# Patient Record
Sex: Female | Born: 1955 | Race: Black or African American | Hispanic: Yes | Marital: Married | Smoking: Never smoker
Health system: Southern US, Community
[De-identification: ages and names within clinical notes are randomized; demographics above are authoritative.]

## PROBLEM LIST (undated history)

## (undated) DIAGNOSIS — N390 Urinary tract infection, site not specified: Secondary | ICD-10-CM

## (undated) DIAGNOSIS — R112 Nausea with vomiting, unspecified: Secondary | ICD-10-CM

## (undated) DIAGNOSIS — T7840XA Allergy, unspecified, initial encounter: Secondary | ICD-10-CM

## (undated) DIAGNOSIS — F419 Anxiety disorder, unspecified: Secondary | ICD-10-CM

## (undated) DIAGNOSIS — F32A Depression, unspecified: Secondary | ICD-10-CM

## (undated) DIAGNOSIS — I890 Lymphedema, not elsewhere classified: Secondary | ICD-10-CM

## (undated) DIAGNOSIS — Z9889 Other specified postprocedural states: Secondary | ICD-10-CM

## (undated) DIAGNOSIS — Z8719 Personal history of other diseases of the digestive system: Secondary | ICD-10-CM

## (undated) DIAGNOSIS — M1990 Unspecified osteoarthritis, unspecified site: Secondary | ICD-10-CM

## (undated) DIAGNOSIS — Z860100 Personal history of colon polyps, unspecified: Secondary | ICD-10-CM

## (undated) DIAGNOSIS — K219 Gastro-esophageal reflux disease without esophagitis: Secondary | ICD-10-CM

## (undated) DIAGNOSIS — G039 Meningitis, unspecified: Secondary | ICD-10-CM

## (undated) DIAGNOSIS — F329 Major depressive disorder, single episode, unspecified: Secondary | ICD-10-CM

## (undated) HISTORY — DX: Personal history of colonic polyps: Z86.010

## (undated) HISTORY — DX: Anxiety disorder, unspecified: F41.9

## (undated) HISTORY — PX: COLONOSCOPY: SHX174

## (undated) HISTORY — DX: Gastro-esophageal reflux disease without esophagitis: K21.9

## (undated) HISTORY — PX: CERVICAL DISCECTOMY: SHX98

## (undated) HISTORY — DX: Personal history of other diseases of the digestive system: Z87.19

## (undated) HISTORY — PX: TOTAL ABDOMINAL HYSTERECTOMY: SHX209

## (undated) HISTORY — PX: SHOULDER SURGERY: SHX246

## (undated) HISTORY — DX: Allergy, unspecified, initial encounter: T78.40XA

## (undated) HISTORY — DX: Urinary tract infection, site not specified: N39.0

## (undated) HISTORY — DX: Unspecified osteoarthritis, unspecified site: M19.90

## (undated) HISTORY — DX: Lymphedema, not elsewhere classified: I89.0

## (undated) HISTORY — PX: ESOPHAGOGASTRODUODENOSCOPY: SHX1529

## (undated) HISTORY — PX: CHOLECYSTECTOMY: SHX55

## (undated) HISTORY — DX: Depression, unspecified: F32.A

## (undated) HISTORY — DX: Major depressive disorder, single episode, unspecified: F32.9

## (undated) HISTORY — PX: ELBOW SURGERY: SHX618

## (undated) HISTORY — PX: FLEXIBLE SIGMOIDOSCOPY: SHX1649

## (undated) HISTORY — DX: Personal history of colon polyps, unspecified: Z86.0100

---

## 1997-07-28 ENCOUNTER — Ambulatory Visit (HOSPITAL_BASED_OUTPATIENT_CLINIC_OR_DEPARTMENT_OTHER): Admission: RE | Admit: 1997-07-28 | Discharge: 1997-07-28 | Payer: Self-pay | Admitting: Orthopedic Surgery

## 1998-03-11 ENCOUNTER — Encounter: Payer: Self-pay | Admitting: Gastroenterology

## 1998-03-11 ENCOUNTER — Inpatient Hospital Stay (HOSPITAL_COMMUNITY): Admission: AD | Admit: 1998-03-11 | Discharge: 1998-03-21 | Payer: Self-pay | Admitting: Gastroenterology

## 1998-03-12 ENCOUNTER — Encounter: Payer: Self-pay | Admitting: Gastroenterology

## 1998-03-13 ENCOUNTER — Encounter: Payer: Self-pay | Admitting: Gastroenterology

## 1998-03-17 ENCOUNTER — Encounter (HOSPITAL_BASED_OUTPATIENT_CLINIC_OR_DEPARTMENT_OTHER): Payer: Self-pay | Admitting: General Surgery

## 1998-03-17 ENCOUNTER — Encounter: Payer: Self-pay | Admitting: Gastroenterology

## 1998-04-06 ENCOUNTER — Other Ambulatory Visit: Admission: RE | Admit: 1998-04-06 | Discharge: 1998-04-06 | Payer: Self-pay | Admitting: Obstetrics and Gynecology

## 1998-06-12 ENCOUNTER — Encounter: Admission: RE | Admit: 1998-06-12 | Discharge: 1998-09-10 | Payer: Self-pay | Admitting: Internal Medicine

## 1998-10-23 ENCOUNTER — Ambulatory Visit (HOSPITAL_BASED_OUTPATIENT_CLINIC_OR_DEPARTMENT_OTHER): Admission: RE | Admit: 1998-10-23 | Discharge: 1998-10-23 | Payer: Self-pay | Admitting: Orthopedic Surgery

## 1999-02-09 ENCOUNTER — Other Ambulatory Visit: Admission: RE | Admit: 1999-02-09 | Discharge: 1999-02-09 | Payer: Self-pay | Admitting: Internal Medicine

## 1999-02-15 ENCOUNTER — Emergency Department (HOSPITAL_COMMUNITY): Admission: EM | Admit: 1999-02-15 | Discharge: 1999-02-15 | Payer: Self-pay | Admitting: *Deleted

## 1999-03-19 ENCOUNTER — Ambulatory Visit (HOSPITAL_COMMUNITY): Admission: RE | Admit: 1999-03-19 | Discharge: 1999-03-19 | Payer: Self-pay | Admitting: Gastroenterology

## 1999-03-19 ENCOUNTER — Encounter: Payer: Self-pay | Admitting: Gastroenterology

## 1999-04-29 ENCOUNTER — Observation Stay (HOSPITAL_COMMUNITY): Admission: RE | Admit: 1999-04-29 | Discharge: 1999-04-30 | Payer: Self-pay | Admitting: Orthopedic Surgery

## 2000-04-13 ENCOUNTER — Ambulatory Visit (HOSPITAL_COMMUNITY): Admission: RE | Admit: 2000-04-13 | Discharge: 2000-04-13 | Payer: Self-pay | Admitting: Internal Medicine

## 2002-05-29 ENCOUNTER — Other Ambulatory Visit: Admission: RE | Admit: 2002-05-29 | Discharge: 2002-05-29 | Payer: Self-pay | Admitting: Internal Medicine

## 2003-01-04 HISTORY — PX: PARTIAL HYSTERECTOMY: SHX80

## 2003-02-10 ENCOUNTER — Ambulatory Visit (HOSPITAL_COMMUNITY): Admission: RE | Admit: 2003-02-10 | Discharge: 2003-02-10 | Payer: Self-pay | Admitting: Orthopedic Surgery

## 2003-02-11 ENCOUNTER — Encounter (INDEPENDENT_AMBULATORY_CARE_PROVIDER_SITE_OTHER): Payer: Self-pay | Admitting: Specialist

## 2003-09-18 ENCOUNTER — Emergency Department (HOSPITAL_COMMUNITY): Admission: EM | Admit: 2003-09-18 | Discharge: 2003-09-18 | Payer: Self-pay | Admitting: Emergency Medicine

## 2003-10-15 ENCOUNTER — Encounter (INDEPENDENT_AMBULATORY_CARE_PROVIDER_SITE_OTHER): Payer: Self-pay | Admitting: *Deleted

## 2003-10-15 ENCOUNTER — Inpatient Hospital Stay (HOSPITAL_COMMUNITY): Admission: AD | Admit: 2003-10-15 | Discharge: 2003-10-17 | Payer: Self-pay | Admitting: Obstetrics and Gynecology

## 2004-03-19 ENCOUNTER — Ambulatory Visit: Payer: Self-pay | Admitting: Internal Medicine

## 2004-04-09 ENCOUNTER — Ambulatory Visit: Payer: Self-pay | Admitting: Internal Medicine

## 2004-04-22 ENCOUNTER — Ambulatory Visit: Payer: Self-pay | Admitting: Gastroenterology

## 2004-05-07 ENCOUNTER — Ambulatory Visit: Payer: Self-pay | Admitting: Gastroenterology

## 2004-12-13 ENCOUNTER — Ambulatory Visit: Payer: Self-pay | Admitting: Internal Medicine

## 2005-06-23 ENCOUNTER — Ambulatory Visit: Payer: Self-pay | Admitting: Internal Medicine

## 2005-11-02 ENCOUNTER — Ambulatory Visit: Payer: Self-pay | Admitting: Internal Medicine

## 2006-06-09 ENCOUNTER — Ambulatory Visit: Payer: Self-pay | Admitting: Internal Medicine

## 2006-06-09 LAB — CONVERTED CEMR LAB
AST: 24 units/L (ref 0–37)
Alkaline Phosphatase: 74 units/L (ref 39–117)
Basophils Absolute: 0 10*3/uL (ref 0.0–0.1)
Calcium: 9.7 mg/dL (ref 8.4–10.5)
Chloride: 104 meq/L (ref 96–112)
Creatinine, Ser: 1 mg/dL (ref 0.4–1.2)
Eosinophils Absolute: 0.1 10*3/uL (ref 0.0–0.6)
Eosinophils Relative: 1.8 % (ref 0.0–5.0)
Glucose, Bld: 89 mg/dL (ref 70–99)
HCT: 40.9 % (ref 36.0–46.0)
Hemoglobin: 13.8 g/dL (ref 12.0–15.0)
Lymphocytes Relative: 32.7 % (ref 12.0–46.0)
MCV: 89.3 fL (ref 78.0–100.0)
Monocytes Absolute: 0.3 10*3/uL (ref 0.2–0.7)
Neutro Abs: 3.2 10*3/uL (ref 1.4–7.7)
Potassium: 4.3 meq/L (ref 3.5–5.1)
RDW: 12.1 % (ref 11.5–14.6)
Total Bilirubin: 0.6 mg/dL (ref 0.3–1.2)
Total CHOL/HDL Ratio: 4
VLDL: 14 mg/dL (ref 0–40)

## 2006-07-04 ENCOUNTER — Ambulatory Visit: Payer: Self-pay | Admitting: Internal Medicine

## 2006-08-21 DIAGNOSIS — K219 Gastro-esophageal reflux disease without esophagitis: Secondary | ICD-10-CM

## 2006-08-21 DIAGNOSIS — Z860100 Personal history of colon polyps, unspecified: Secondary | ICD-10-CM | POA: Insufficient documentation

## 2006-08-21 DIAGNOSIS — J309 Allergic rhinitis, unspecified: Secondary | ICD-10-CM | POA: Insufficient documentation

## 2006-08-21 DIAGNOSIS — F329 Major depressive disorder, single episode, unspecified: Secondary | ICD-10-CM

## 2006-09-07 ENCOUNTER — Encounter: Payer: Self-pay | Admitting: Internal Medicine

## 2006-10-17 ENCOUNTER — Telehealth (INDEPENDENT_AMBULATORY_CARE_PROVIDER_SITE_OTHER): Payer: Self-pay | Admitting: *Deleted

## 2006-11-08 ENCOUNTER — Telehealth: Payer: Self-pay | Admitting: Internal Medicine

## 2006-12-05 ENCOUNTER — Ambulatory Visit: Payer: Self-pay | Admitting: Internal Medicine

## 2006-12-05 DIAGNOSIS — E785 Hyperlipidemia, unspecified: Secondary | ICD-10-CM

## 2006-12-07 LAB — CONVERTED CEMR LAB
Cholesterol: 231 mg/dL (ref 0–200)
Total CHOL/HDL Ratio: 3.9
Triglycerides: 47 mg/dL (ref 0–149)
VLDL: 9 mg/dL (ref 0–40)

## 2006-12-12 ENCOUNTER — Ambulatory Visit: Payer: Self-pay | Admitting: Internal Medicine

## 2006-12-12 DIAGNOSIS — I890 Lymphedema, not elsewhere classified: Secondary | ICD-10-CM | POA: Insufficient documentation

## 2006-12-12 DIAGNOSIS — G4700 Insomnia, unspecified: Secondary | ICD-10-CM

## 2006-12-12 DIAGNOSIS — M899 Disorder of bone, unspecified: Secondary | ICD-10-CM | POA: Insufficient documentation

## 2006-12-12 DIAGNOSIS — M949 Disorder of cartilage, unspecified: Secondary | ICD-10-CM

## 2006-12-12 LAB — CONVERTED CEMR LAB: Vit D, 1,25-Dihydroxy: 47 (ref 30–89)

## 2006-12-14 ENCOUNTER — Encounter: Payer: Self-pay | Admitting: Internal Medicine

## 2007-03-13 ENCOUNTER — Ambulatory Visit: Payer: Self-pay | Admitting: Internal Medicine

## 2007-03-15 LAB — CONVERTED CEMR LAB
Direct LDL: 138.7 mg/dL
HDL: 55.6 mg/dL (ref >39.0)
Total CHOL/HDL Ratio: 3.7
Triglycerides: 50 mg/dL (ref 0–149)

## 2007-05-14 ENCOUNTER — Telehealth: Payer: Self-pay | Admitting: Internal Medicine

## 2007-05-16 ENCOUNTER — Encounter: Payer: Self-pay | Admitting: Internal Medicine

## 2007-06-07 ENCOUNTER — Telehealth: Payer: Self-pay | Admitting: Internal Medicine

## 2007-09-17 ENCOUNTER — Ambulatory Visit: Payer: Self-pay | Admitting: Internal Medicine

## 2007-09-17 LAB — CONVERTED CEMR LAB
HDL: 57.9 mg/dL (ref >39.0)
Triglycerides: 41 mg/dL (ref 0–149)

## 2007-09-24 ENCOUNTER — Ambulatory Visit: Payer: Self-pay | Admitting: Internal Medicine

## 2007-09-24 DIAGNOSIS — M542 Cervicalgia: Secondary | ICD-10-CM | POA: Insufficient documentation

## 2007-09-27 ENCOUNTER — Encounter: Payer: Self-pay | Admitting: Internal Medicine

## 2007-10-02 ENCOUNTER — Telehealth: Payer: Self-pay | Admitting: Internal Medicine

## 2007-10-11 ENCOUNTER — Encounter: Payer: Self-pay | Admitting: Internal Medicine

## 2007-12-11 ENCOUNTER — Telehealth: Payer: Self-pay | Admitting: Internal Medicine

## 2008-04-01 ENCOUNTER — Telehealth: Payer: Self-pay | Admitting: Internal Medicine

## 2008-04-08 ENCOUNTER — Ambulatory Visit: Payer: Self-pay | Admitting: Internal Medicine

## 2008-04-08 DIAGNOSIS — Q820 Hereditary lymphedema: Secondary | ICD-10-CM

## 2008-06-27 ENCOUNTER — Telehealth: Payer: Self-pay | Admitting: *Deleted

## 2008-07-14 ENCOUNTER — Telehealth: Payer: Self-pay | Admitting: Internal Medicine

## 2008-09-17 ENCOUNTER — Telehealth: Payer: Self-pay | Admitting: *Deleted

## 2008-10-15 ENCOUNTER — Ambulatory Visit: Payer: Self-pay | Admitting: Internal Medicine

## 2008-10-16 ENCOUNTER — Encounter: Payer: Self-pay | Admitting: Internal Medicine

## 2008-11-03 ENCOUNTER — Ambulatory Visit: Payer: Self-pay | Admitting: Internal Medicine

## 2008-12-19 ENCOUNTER — Telehealth: Payer: Self-pay | Admitting: *Deleted

## 2008-12-31 ENCOUNTER — Telehealth: Payer: Self-pay | Admitting: *Deleted

## 2009-01-02 ENCOUNTER — Encounter: Payer: Self-pay | Admitting: Internal Medicine

## 2009-02-10 ENCOUNTER — Ambulatory Visit: Payer: Self-pay | Admitting: Internal Medicine

## 2009-02-10 DIAGNOSIS — E65 Localized adiposity: Secondary | ICD-10-CM

## 2009-02-10 DIAGNOSIS — R635 Abnormal weight gain: Secondary | ICD-10-CM | POA: Insufficient documentation

## 2009-02-17 ENCOUNTER — Ambulatory Visit: Payer: Self-pay | Admitting: Internal Medicine

## 2009-02-17 LAB — CONVERTED CEMR LAB
AST: 19 units/L (ref 0–37)
BUN: 9 mg/dL (ref 6–23)
Basophils Absolute: 0.1 10*3/uL (ref 0.0–0.1)
Bilirubin Urine: NEGATIVE
Bilirubin, Direct: 0.1 mg/dL (ref 0.0–0.3)
Blood in Urine, dipstick: NEGATIVE
CO2: 29 meq/L (ref 19–32)
Calcium: 9.5 mg/dL (ref 8.4–10.5)
Cholesterol: 226 mg/dL — ABNORMAL HIGH (ref 0–200)
Eosinophils Absolute: 0.1 10*3/uL (ref 0.0–0.7)
GFR calc non Af Amer: 60.34 mL/min (ref >60)
Glucose, Bld: 74 mg/dL (ref 70–99)
Glucose, Urine, Semiquant: NEGATIVE
HCT: 41 % (ref 36.0–46.0)
HDL: 67.3 mg/dL (ref >39.00)
Hemoglobin: 13.7 g/dL (ref 12.0–15.0)
MCV: 89.8 fL (ref 78.0–100.0)
Monocytes Absolute: 0.4 10*3/uL (ref 0.1–1.0)
Monocytes Relative: 7 % (ref 3.0–12.0)
Platelets: 248 10*3/uL (ref 150.0–400.0)
Potassium: 4.4 meq/L (ref 3.5–5.1)
RBC: 4.57 M/uL (ref 3.87–5.11)
Sodium: 145 meq/L (ref 135–145)
Specific Gravity, Urine: 1.025
TSH: 1.47 microintl units/mL (ref 0.35–5.50)
Total CHOL/HDL Ratio: 3
Urobilinogen, UA: 0.2
WBC Urine, dipstick: NEGATIVE
WBC: 5.3 10*3/uL (ref 4.5–10.5)

## 2009-02-24 ENCOUNTER — Ambulatory Visit: Payer: Self-pay | Admitting: Internal Medicine

## 2009-03-03 LAB — CONVERTED CEMR LAB: Volume, Urine-CORTUR: 2500 mL

## 2009-05-20 ENCOUNTER — Telehealth: Payer: Self-pay | Admitting: *Deleted

## 2009-07-10 ENCOUNTER — Telehealth: Payer: Self-pay | Admitting: Internal Medicine

## 2009-07-20 ENCOUNTER — Telehealth: Payer: Self-pay | Admitting: *Deleted

## 2009-08-17 ENCOUNTER — Encounter: Payer: Self-pay | Admitting: Internal Medicine

## 2009-11-05 IMAGING — CR DG CERVICAL SPINE COMPLETE 4+V
5 series · 5 of 5 positions shown · non-contrast
Comparison: None

CLINICAL DATA: Neck pain

CERVICAL SPINE - COMPLETE 4+ VIEW

[view not recorded (1 of 5)]
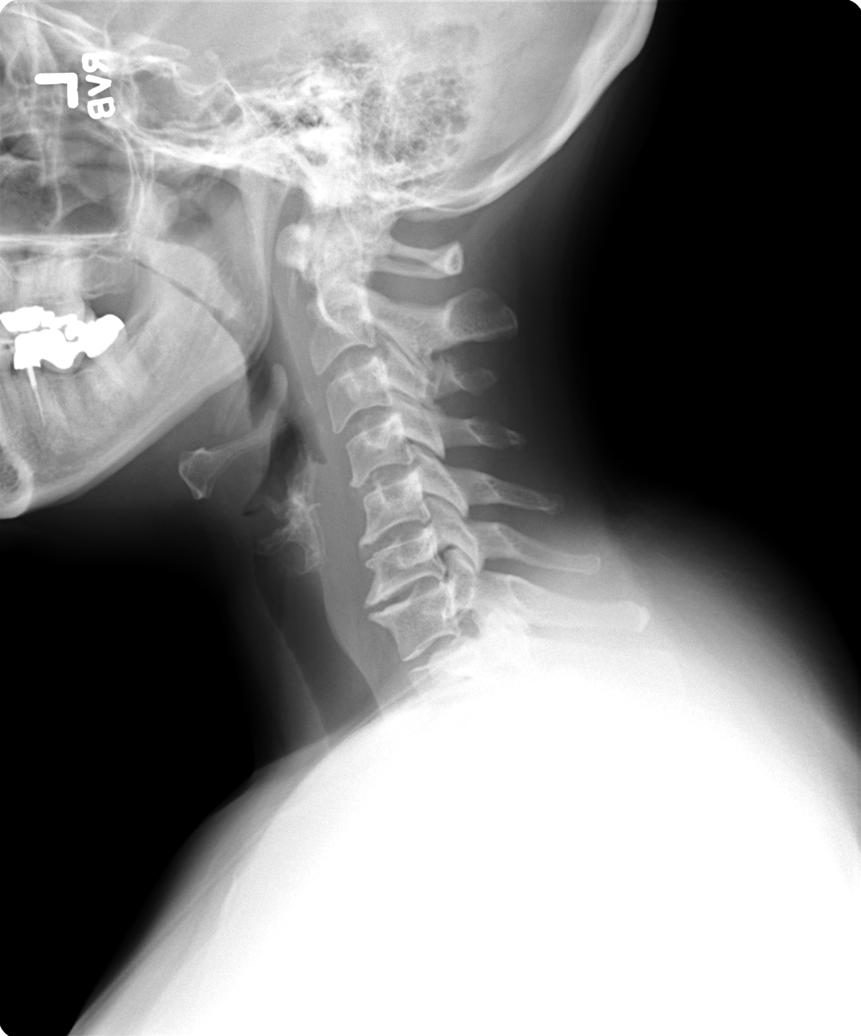

[view not recorded (2 of 5)]
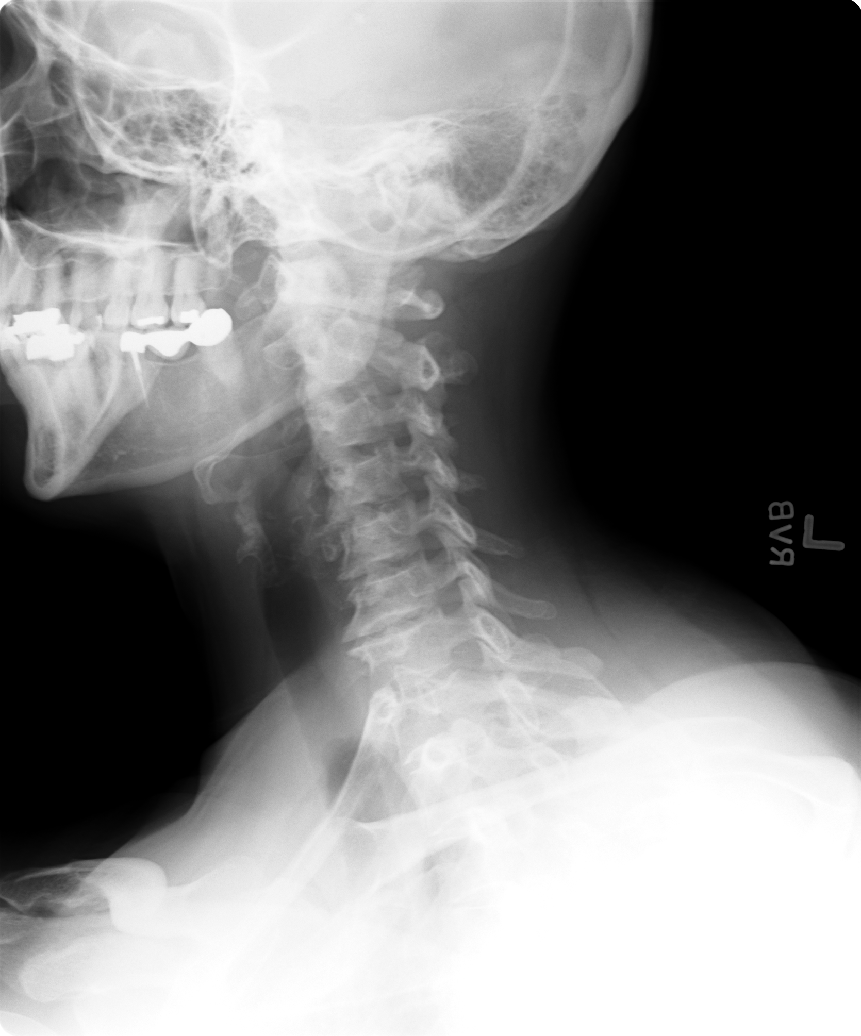

[view not recorded (3 of 5)]
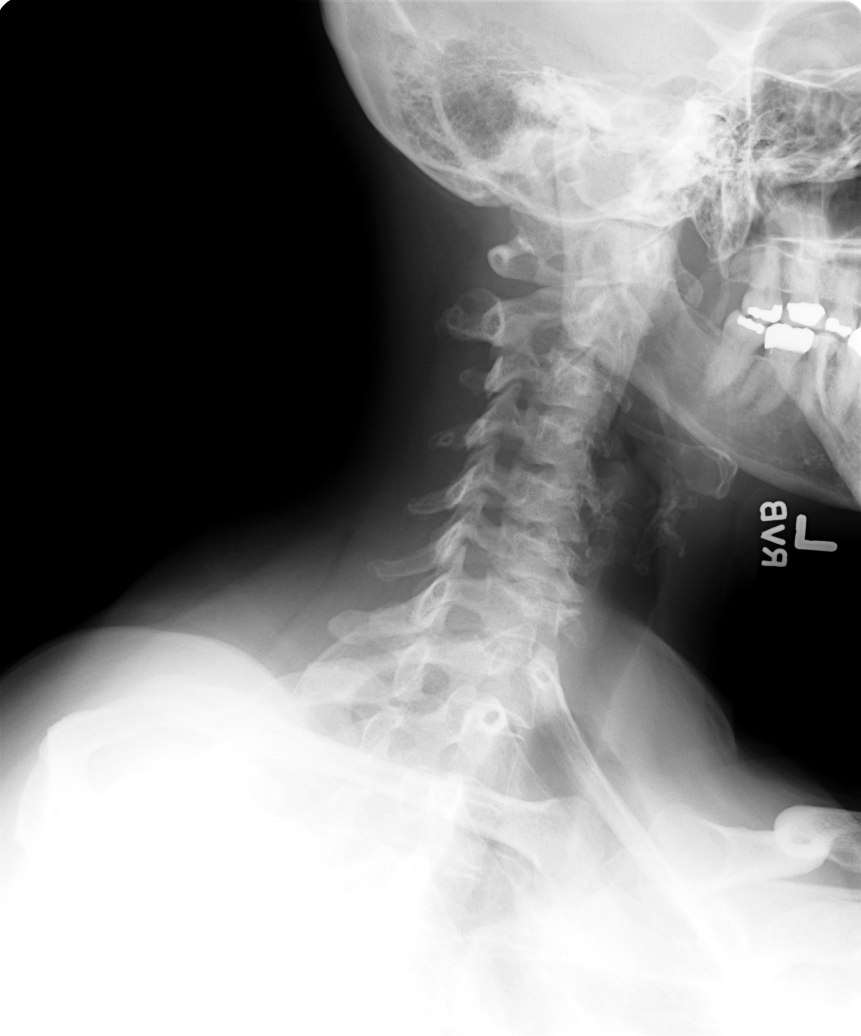

[view not recorded (4 of 5)]
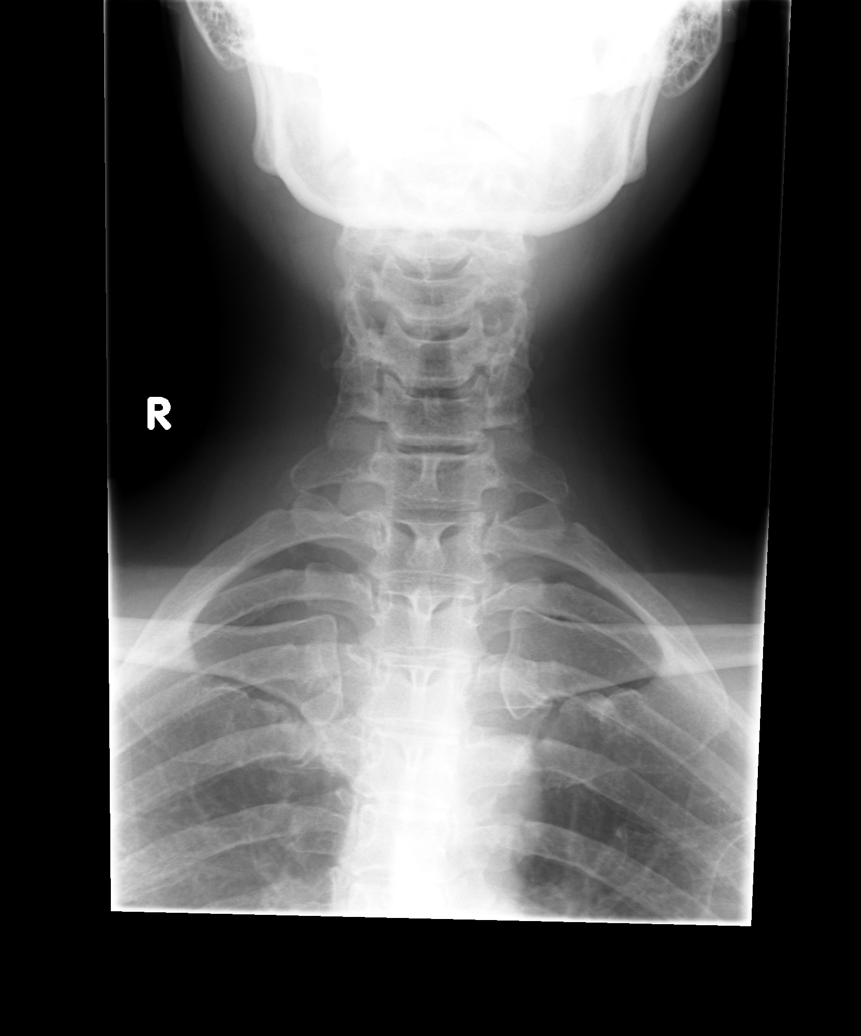

[view not recorded (5 of 5)]
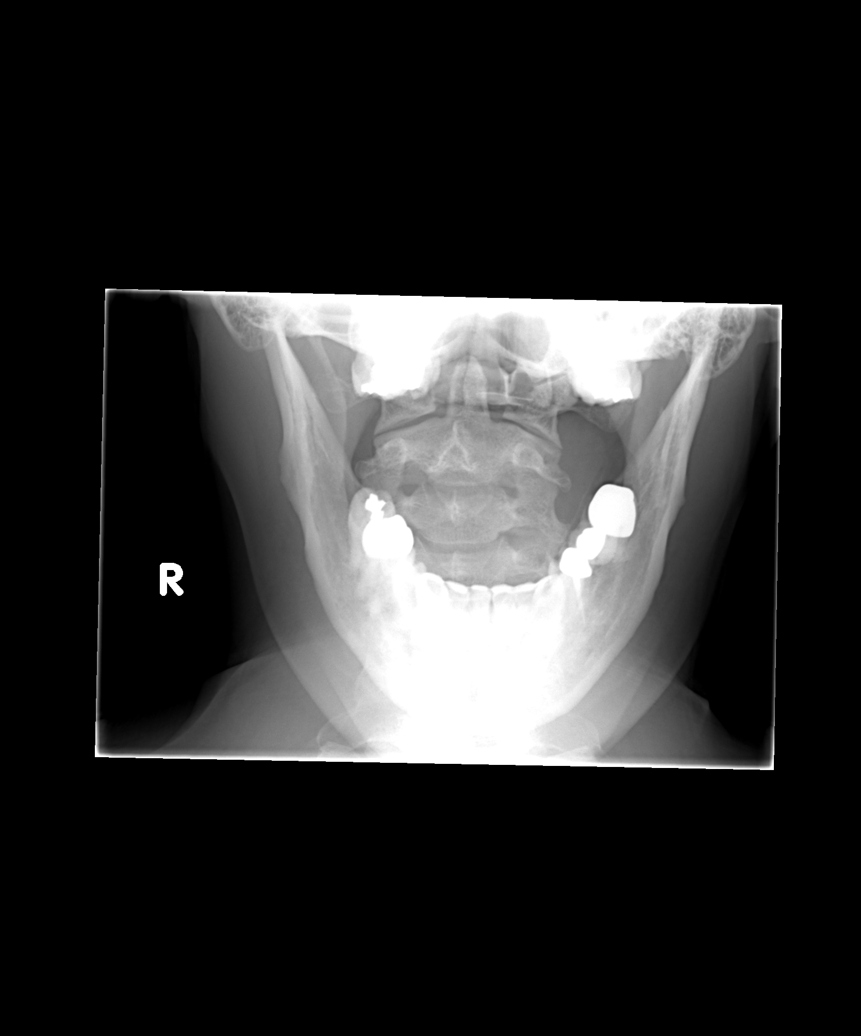

[5 of 5 positions shown; findings below may reference images not displayed]

FINDINGS: Five view exam shows straightening of the normal cervical
lordosis.  There is no evidence for acute fracture.  No
subluxation.  Loss of disc height is most prominent at the C6-7
level where there is associated anterior endplate spurring.  Less
advanced degenerative changes are seen at C5-6.  The facets are
well-aligned bilaterally.  No bony neural foraminal encroachment.
No prevertebral soft tissue swelling.
IMPRESSION: Degenerative changes at C6-7, and to a lesser degree at C5-6.

Loss of cervical lordosis.  This can be related to patient
positioning, muscle spasm or soft tissue injury.

## 2010-01-05 ENCOUNTER — Telehealth: Payer: Self-pay | Admitting: Internal Medicine

## 2010-01-14 ENCOUNTER — Telehealth: Payer: Self-pay | Admitting: *Deleted

## 2010-01-18 ENCOUNTER — Encounter: Payer: Self-pay | Admitting: Internal Medicine

## 2010-02-02 NOTE — Progress Notes (Signed)
Summary: [facility] rx  Phone Note Call from Patient Call back at Home Phone [phone]   Caller: Patient Call For: [name] MD Reason for Call: Talk to Doctor Summary of Call: pt needs refill on generic ambien 10 mg #90 with refills fax to [facility] [phone]. Initial call taken by: Heron Sabins,  July 10, 2009 10:22 AM  Follow-up for Phone Call        ok x  6 months worth Follow-up by: [name] MD,  July 10, 2009 5:06 PM    Prescriptions: AMBIEN 10 MG  TABS (ZOLPIDEM TARTRATE) 1 by mouth at bedtime  #90 x 2   Entered by:   Duard Brady LPN   Authorized by:   [name] MD   Signed by:   Duard Brady LPN on 95/62/1308   Method used:   Printed then faxed to ...       [facility] (mail-order)             , [place]         Ph: [phone]       Fax: [phone]   RxID:   [id number]

## 2010-02-02 NOTE — Assessment & Plan Note (Signed)
Summary: fu on labs/njr   Vital Signs:  Patient profile:   55 year old female Menstrual status:  hysterectomy Weight:      206 pounds BMI:     33.37 Temp:     98.0 degrees F oral Pulse rate:   68 / minute Pulse rhythm:   regular BP sitting:   116 / 84  (right arm) Cuff size:   regular  Vitals Entered By: Raechel Ache, RN (February 24, 2009 11:24 AM) CC: F/u labs.   History of Present Illness: Lindsay Parks comesin today for   for follow up of .labs  concerning  weight gain and fat pad development. Feels something isnt right.  no new symptom and hard to tell how much  edema  causing weight gain  Allergies: No Known Drug Allergies  Past History:  Past medical, surgical, family and social histories (including risk factors) reviewed for relevance to current acute and chronic problems.  Past Medical History: Reviewed history from 04/08/2008 and no changes required. Allergic rhinitis GERD  stricture 2001 Colonic polyps, hx of Allergies Depression UTI's lyphedema lle  [facility]    Past Surgical History: Reviewed history from 04/08/2008 and no changes required. EGD Flex Sigmoidoscopy Hysterectomy left hydrosalpinx ovarian cyst Colonoscopy    Family History: Reviewed history from 04/08/2008 and no changes required. Family History of Arthritis Family History of Colon CA 1st degree relative <60 Family History of Stroke F 1st degree relative  mom Family History High cholesterol   DM      Social History: Reviewed history from 02/10/2009 and no changes required. Never Smoked Alcohol use-no Drug use-no Regular exercise-yes Occupation: retired from [employer] Married husband with parkinsons  2 children    Physical Exam  General:  Well-developed,well-nourished,in no acute distress; alert,appropriate and cooperative throughout   Skin:  turgor normal and color normal.   Cervical Nodes:  No lymphadenopathy noted reviewed labs   24 hour urine  cortisol result is not back yet.  Impression & Recommendations:  Problem # 1:  LOCALIZED ADIPOSITY (ICD-278.1) I do not think    related to  HRT .    lab nl so far except elevated ldl.  Problem # 2:  WEIGHT GAIN, ABNORMAL (ICD-783.1) prob age and situational    2 week calendar  if lab abn then  endo consult otherwise   .  decrease calorties and increase activity.  Problem # 3:  HYPERLIPIDEMIA (ICD-272.4) counseled  Labs Reviewed: SGOT: 19 (02/17/2009)   SGPT: 17 (02/17/2009)   HDL:67.30 (02/17/2009), 57.9 (09/17/2007)  LDL:DEL (09/17/2007), DEL (03/13/2007)  Chol:226 (02/17/2009), 228 (09/17/2007)  Trig:52.0 (02/17/2009), 41 (09/17/2007)  Complete Medication List: 1)  Sm Calcium/vitamin D 500-200 Mg-unit Tabs (Calcium carbonate-vitamin d) .... Take 2 tablet by mouth once a day 2)  Vivelle-dot 0.1 Mg/24hr Pttw (Estradiol) .... Use 1 patch twice weekly as directed 3)  Ambien 10 Mg Tabs (Zolpidem tartrate) .Marland Kitchen.. 1 by mouth at bedtime 4)  Keflex 500 Mg Caps (Cephalexin) .Marland Kitchen.. 1 by mouth three times a day for 10 days greater than 50% of visit spent in counseling  15 minutes

## 2010-02-02 NOTE — Progress Notes (Signed)
Summary: Needs Order For Lymphedema  Phone Note From Other Clinic Call back at [phone]   Caller: Robin @ [facility] Summary of Call: Needs rx faxed over, stating lymphedema lower extremity evaluate and treat.  Fax # [phone] Nelva Nay. Initial call taken by: Trixie Dredge,  July 20, 2009 11:02 AM  Follow-up for Phone Call        please do this  dx  congentital lymphedema Milroys disease Follow-up by: [name] MD,  July 20, 2009 1:19 PM  Additional Follow-up for Phone Call Additional follow up Details #1::        Order sent to [facility]. Additional Follow-up by: Romualdo Bolk, CMA (AAMA),  July 20, 2009 3:40 PM

## 2010-02-02 NOTE — Miscellaneous (Signed)
Summary: Physical Therapy Progress Note/[facility]  Physical Therapy Progress Note/[facility]   Imported By: Maryln Gottron 08/31/2009 12:57:56  _____________________________________________________________________  External Attachment:    Type:   Image     Comment:   External Document

## 2010-02-02 NOTE — Assessment & Plan Note (Signed)
Summary: mass back of neck/dm   Vital Signs:  Patient profile:   55 year old female Menstrual status:  hysterectomy Height:      66 inches Weight:      208 pounds BMI:     33.69 Temp:     97.6 degrees F oral Pulse rate:   72 / minute BP sitting:   120 / 80  (right arm) Cuff size:   regular  Vitals Entered By: Romualdo Bolk, CMA (AAMA) (February 10, 2009 2:14 PM) CC: Swelling on back between shoulderblades that is growing up towards head. Pt has been there since Jan but has gotten more sensitive since the past few weeks.   History of Present Illness: Lindsay Parks comes in today with husband   foe sda because she is having increasing   size of the lump at the base of her neck over the past few months. No trauma   but is gaining weight  . has been less active recently  .  No Cp sob or new numbness or weakness.  Some discomfort  no UE radiation.  Insurance wont pay for a new stocking so left leg tends to be more swollen.  Sleep no change. Taking meds    Preventive Screening-Counseling & Management  Alcohol-Tobacco     Alcohol drinks/day: <1     Smoking Status: never  Caffeine-Diet-Exercise     Caffeine use/day: 2     Does Patient Exercise: yes  Current Medications (verified): 1)  Sm Calcium/vitamin D 500-200 Mg-Unit Tabs (Calcium Carbonate-Vitamin D) .... Take 2 Tablet By Mouth Once A Day 2)  Vivelle-Dot 0.1 Mg/24hr  Pttw (Estradiol) .... Use 1 Patch Twice Weekly As Directed 3)  Ambien 10 Mg  Tabs (Zolpidem Tartrate) .Marland Kitchen.. 1 By Mouth At Bedtime 4)  Keflex 500 Mg Caps (Cephalexin) .Marland Kitchen.. 1 By Mouth Three Times A Day For 10 Days  Allergies (verified): No Known Drug Allergies  Past History:  Past medical, surgical, family and social histories (including risk factors) reviewed, and no changes noted (except as noted below).  Past Medical History: Reviewed history from 04/08/2008 and no changes required. Allergic rhinitis GERD  stricture 2001 Colonic polyps, hx  of Allergies Depression UTI's lyphedema lle  [facility]    Past Surgical History: Reviewed history from 04/08/2008 and no changes required. EGD Flex Sigmoidoscopy Hysterectomy left hydrosalpinx ovarian cyst Colonoscopy    Past History:  Care Management: Gastroenterology: Dr. Arlyce Dice Gynecology: Dr. Marcelle Overlie  Dermatology: Dr. Mayford Knife  Family History: Reviewed history from 04/08/2008 and no changes required. Family History of Arthritis Family History of Colon CA 1st degree relative <60 Family History of Stroke F 1st degree relative  mom Family History High cholesterol   DM      Social History: Reviewed history from 04/08/2008 and no changes required. Never Smoked Alcohol use-no Drug use-no Regular exercise-yes Occupation: retired from [employer] Married husband with parkinsons  2 children    Review of Systems       The patient complains of weight gain.  The patient denies anorexia, fever, weight loss, vision loss, decreased hearing, hoarseness, chest pain, syncope, peripheral edema, prolonged cough, hemoptysis, abdominal pain, melena, hematochezia, severe indigestion/heartburn, muscle weakness, depression, abnormal bleeding, enlarged lymph nodes, and angioedema.    Physical Exam  General:  Well-developed,well-nourished,in no acute distress; alert,appropriate and cooperative throughout examination Head:  normocephalic and atraumatic.   Eyes:  vision grossly intact, pupils equal, pupils round, and pupils reactive to light.  Ears:  no external deformities.   Nose:  no external deformity and no external erythema.   Neck:  supple.  buffalo type hump  vs lipoma type consitency at  base of thoracic spine  no point tenderness and no bomy lesions   Lungs:  Normal respiratory effort, chest expands symmetrically. Lungs are clear to auscultation, no crackles or wheezes. Heart:  normal rate, regular rhythm, no murmur, no gallop, no rub, no JVD, and no lifts.    Abdomen:  Bowel sounds positive,abdomen soft and non-tender without masses, organomegaly or   noted. Msk:  no redness or warmth Extremities:  legs  in stockings   with edema 3+ left more than right. Neurologic:  alert & oriented X3 and gait normal.   Skin:  no striae  or acute rashes  Cervical Nodes:  No lymphadenopathy noted   Impression & Recommendations:  Problem # 1:  WEIGHT GAIN, ABNORMAL (ICD-783.1) prob  exogenous  wieth gain and no other cushinoid characteristics but wll  do metabolic check.  Orders: T-Cortisol, Free (24 hr.urine) [phone])  Problem # 2:  LOCALIZED ADIPOSITY (ICD-278.1) previous neck x ray shows loss of lordosis otherwise nl.   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 Take 2 tablet by mouth once a day 2)  Vivelle-dot 0.1 Mg/24hr Pttw (Estradiol) .... Use 1 patch twice weekly as directed 3)  Ambien 10 Mg Tabs (Zolpidem tartrate) .Marland Kitchen.. 1 by mouth at bedtime 4)  Keflex 500 Mg Caps (Cephalexin) .Marland Kitchen.. 1 by mouth three times a day for 10 days  Patient Instructions: 1)  Reschedule  cpx labs  earlier( this month)   add Free T4  and am cortisol to these labs 2)  Also 24 hour urine for urinary free Cortisol l. (  dx weight gain abnormal)

## 2010-02-02 NOTE — Progress Notes (Signed)
Summary: cephalexin  Phone Note Call from Patient Call back at [facility] Phone [phone]   Summary of Call: Refills expired.  Cephalexin that she takes for lympedema.  Getting ready to cut toenails.  Has a nick from last week from when she shaved under arms & got nick.  Took 2 then.  Has 2 left.  [place] [address].  Initial call taken by: Rudy Jew, RN,  May 20, 2009 3:58 PM  Follow-up for Phone Call        Rx sent to pharmacy Follow-up by: Romualdo Bolk, CMA Duncan Dull),  May 20, 2009 4:12 PM    Prescriptions: KEFLEX 500 MG CAPS (CEPHALEXIN) 1 by mouth three times a day for 10 days  #30 x 0   Entered by:   Romualdo Bolk, CMA (AAMA)   Authorized by:   [name] MD   Signed by:   Romualdo Bolk, CMA (AAMA) on 05/20/2009   Method used:   Electronically to        [facility] [phone]* (retail)       [address]       [place], [place]  [address]       Ph: [phone]       Fax: [phone]   RxID:   [id number]

## 2010-02-02 NOTE — Medication Information (Signed)
Summary: Coverage Approval for Ambien/[employer]  Coverage Approval for Ambien/[employer]   Imported By: Maryln Gottron 01/09/2009 09:43:09  _____________________________________________________________________  External Attachment:    Type:   Image     Comment:   External Document

## 2010-02-04 NOTE — Medication Information (Signed)
Summary: Ambien Approved  Ambien Approved   Imported By: Maryln Gottron 01/19/2010 13:39:03  _____________________________________________________________________  External Attachment:    Type:   Image     Comment:   External Document

## 2010-02-04 NOTE — Progress Notes (Signed)
Summary: refill at new pharmacies*******  Phone Note Refill Request Call back at Home Phone [phone] Message from:  Patient----live call on *****new pharmacies*********  Refills Requested: Medication #1:  AMBIEN 10 MG  TABS 1 by mouth at bedtime   Brand Name Necessary? No New 2 rxs: send mail order to [facility] and [facility] supply [facility] please call pt when done.  Initial call taken by: Warnell Forester,  January 14, 2010 11:28 AM  Follow-up for Phone Call        call in #30 with no rf locally. Have Dr. Fabian Sharp do the 90 day refills Follow-up by: Nelwyn Salisbury MD,  January 14, 2010 1:29 PM  Additional Follow-up for Phone Call Additional follow up Details #1::        Rx called in for 30 days only not 60 days. Left message for pt to call back about this. Additional Follow-up by: Romualdo Bolk, CMA Duncan Dull),  January 14, 2010 1:41 PM    Additional Follow-up for Phone Call Additional follow up Details #2::    Pt aware of this. I will route this to Dr. Fabian Sharp to approve for 90 days. Follow-up by: Romualdo Bolk, CMA Duncan Dull),  January 14, 2010 2:16 PM  Additional Follow-up for Phone Call Additional follow up Details #3:: Details for Additional Follow-up Action Taken: due for a yearly visit and has appt in April.  ok to do refills until that appt.  Additional Follow-up by: [name] MD,  January 17, 2010 9:29 PM  Prescriptions: AMBIEN 10 MG  TABS (ZOLPIDEM TARTRATE) 1 by mouth at bedtime  #90 x 0   Entered by:   Romualdo Bolk, CMA (AAMA)   Authorized by:   [name] MD   Signed by:   Romualdo Bolk, CMA (AAMA) on 01/18/2010   Method used:   Printed then faxed to ...       [facility]       [address] [place], [place]  [address]       Ph: [phone]       Fax: [phone]   RxID:   [id number] AMBIEN 10 MG  TABS (ZOLPIDEM TARTRATE) 1 by mouth at bedtime  #30 x 0   Entered by:   Romualdo Bolk, CMA  (AAMA)   Authorized by:   Nelwyn Salisbury MD   Signed by:   Romualdo Bolk, CMA (AAMA) on 01/14/2010   Method used:   [facility]ph[facility] ...       [facility]  [address] [id number]       [address]       [place], [place]  [address]       Ph: [phone] or [phone]       Fax: [phone]   RxID:   [id number] AMBIEN 10 MG  TABS (ZOLPIDEM TARTRATE) 1 by mouth at bedtime  #60 x 0   Entered by:   Romualdo Bolk, CMA (AAMA)   Authorized by:   Nelwyn Salisbury MD   Signed by:   Romualdo Bolk, CMA (AAMA) on 01/14/2010   Method used:   [facility]ph[facility] ...       [facility]  [address] [id number]       [address]       [facility] [place], [place]  [address]       Ph: [phone] or [phone]       Fax: [phone]   RxID:   [id number]  [facility]. Pt aware of this. Romualdo Bolk, CMA (AAMA)  January 19, 2010 2:02 PM

## 2010-02-04 NOTE — Progress Notes (Signed)
Summary: prior auth zolpidem 10mg  1qd  Phone Note Call from Patient Call back at Home Phone [phone]   Caller: Patient---live call Summary of Call: need prior auth for Ambien send to [facility]---[employer] fax--[phone]     ph--[phone] Initial call taken by: Warnell Forester,  January 05, 2010 1:01 PM  Follow-up for Phone Call        Zolpidem Tartrate has been approved  until 10-30-2010 per [facility]. Follow-up by: Warnell Forester,  January 14, 2010 11:18 AM  Additional Follow-up for Phone Call Additional follow up Details #1::        pt is aware of rx. Additional Follow-up by: Warnell Forester,  January 14, 2010 11:24 AM

## 2010-04-20 ENCOUNTER — Other Ambulatory Visit (INDEPENDENT_AMBULATORY_CARE_PROVIDER_SITE_OTHER): Payer: Federal, State, Local not specified - PPO | Admitting: Internal Medicine

## 2010-04-20 DIAGNOSIS — Z0000 Encounter for general adult medical examination without abnormal findings: Secondary | ICD-10-CM

## 2010-04-20 DIAGNOSIS — Z13220 Encounter for screening for lipoid disorders: Secondary | ICD-10-CM

## 2010-04-20 LAB — HEPATIC FUNCTION PANEL
ALT: 13 U/L (ref 0–35)
AST: 17 U/L (ref 0–37)
Alkaline Phosphatase: 67 U/L (ref 39–117)
Total Protein: 6.6 g/dL (ref 6.0–8.3)

## 2010-04-20 LAB — CBC WITH DIFFERENTIAL/PLATELET
Basophils Absolute: 0 10*3/uL (ref 0.0–0.1)
Basophils Relative: 0.6 % (ref 0.0–3.0)
Hemoglobin: 13.4 g/dL (ref 12.0–15.0)
Lymphocytes Relative: 29.3 % (ref 12.0–46.0)
Lymphs Abs: 1.6 10*3/uL (ref 0.7–4.0)
MCHC: 34.3 g/dL (ref 30.0–36.0)
Monocytes Absolute: 0.3 10*3/uL (ref 0.1–1.0)
RBC: 4.45 Mil/uL (ref 3.87–5.11)
WBC: 5.6 10*3/uL (ref 4.5–10.5)

## 2010-04-20 LAB — POCT URINALYSIS DIPSTICK
Nitrite, UA: NEGATIVE
Protein, UA: NEGATIVE
Spec Grav, UA: 1.03
Urobilinogen, UA: 0.2
pH, UA: 5

## 2010-04-20 LAB — BASIC METABOLIC PANEL
CO2: 29 mEq/L (ref 19–32)
Chloride: 106 mEq/L (ref 96–112)
Creatinine, Ser: 1 mg/dL (ref 0.4–1.2)
GFR: 75 mL/min (ref >60.00)
Glucose, Bld: 88 mg/dL (ref 70–99)
Potassium: 4.3 mEq/L (ref 3.5–5.1)

## 2010-04-20 LAB — LIPID PANEL
Cholesterol: 230 mg/dL — ABNORMAL HIGH (ref 0–200)
Total CHOL/HDL Ratio: 4
Triglycerides: 65 mg/dL (ref 0.0–149.0)
VLDL: 13 mg/dL (ref 0.0–40.0)

## 2010-04-22 ENCOUNTER — Encounter: Payer: Self-pay | Admitting: Internal Medicine

## 2010-04-27 ENCOUNTER — Encounter: Payer: Self-pay | Admitting: Internal Medicine

## 2010-04-27 ENCOUNTER — Ambulatory Visit (INDEPENDENT_AMBULATORY_CARE_PROVIDER_SITE_OTHER): Payer: Federal, State, Local not specified - PPO | Admitting: Internal Medicine

## 2010-04-27 VITALS — BP 120/60 | HR 66 | Ht 66.0 in | Wt 201.0 lb

## 2010-04-27 DIAGNOSIS — E785 Hyperlipidemia, unspecified: Secondary | ICD-10-CM

## 2010-04-27 DIAGNOSIS — K219 Gastro-esophageal reflux disease without esophagitis: Secondary | ICD-10-CM

## 2010-04-27 DIAGNOSIS — Q820 Hereditary lymphedema: Secondary | ICD-10-CM

## 2010-04-27 DIAGNOSIS — Z23 Encounter for immunization: Secondary | ICD-10-CM

## 2010-04-27 DIAGNOSIS — G4700 Insomnia, unspecified: Secondary | ICD-10-CM

## 2010-04-27 DIAGNOSIS — Z1211 Encounter for screening for malignant neoplasm of colon: Secondary | ICD-10-CM

## 2010-04-27 DIAGNOSIS — Z0000 Encounter for general adult medical examination without abnormal findings: Secondary | ICD-10-CM

## 2010-04-27 DIAGNOSIS — L309 Dermatitis, unspecified: Secondary | ICD-10-CM

## 2010-04-27 DIAGNOSIS — L259 Unspecified contact dermatitis, unspecified cause: Secondary | ICD-10-CM

## 2010-04-27 MED ORDER — ZOLPIDEM TARTRATE 10 MG PO TABS: 10.0000 mg | ORAL_TABLET | Freq: Every evening | ORAL | Status: DC | PRN

## 2010-04-27 MED ORDER — CEPHALEXIN 500 MG PO CAPS: 500.0000 mg | ORAL_CAPSULE | Freq: Three times a day (TID) | ORAL | Status: DC

## 2010-04-27 NOTE — Patient Instructions (Addendum)
INtensify  Lifestyle intervention for lipid control  Try extending the estrogen patch again.  When convenient to see if we can decrease dosing next year. ROV in 6 months for med check   And check up in 1 year .

## 2010-04-27 NOTE — Progress Notes (Signed)
  Subjective:    Patient ID: Lindsay Parks, female    DOB: April 23, 1955, 55 y.o.   MRN: [id number]  HPI Patient comesin today for PV and med checks.  No major change in health status since last visit .  She is going back to school at [employer]  [place] and doing well to do conflict resolution Social work  Sales promotion account executive.      LEgs are about the same but needs and rx for  Compression stocking .  Still cant get a pt for leg wraps for now. No recnelt infection but neesd refill of med just in case  Sleep : no change taking meds noghtly HRT:   No change  Had tried to skip a day and had hot flushed  Last year.   Past history family history social history reviewed in the electronic medical record.   Review of Systems Vision hearing ok . Change in bowel habits     Blood in stool. Lymphedema   Getting worse . Needs rx for stocking.     Takes advil or aleve at times for ms pain.  Has ecxema and sees derm  given steroids recently .  Rash on elbows and forearms.    Objective:   Physical Exam Physical Exam: Vital signs reviewed WUJ:WJXB is a well-developed well-nourished alert cooperative  AA  female who appears her stated age in no acute distress.  HEENT: normocephalic  traumatic , Eyes: PERRL EOM's full, conjunctiva clear, Nares: paten,t no deformity discharge or tenderness., Ears: no deformity EAC's clear TMs with normal landmarks. Mouth: clear OP, no lesions, edema.  Moist mucous membranes. Dentition in adequate repair. NECK: supple without masses, thyromegaly or bruits. Breast: normal by inspection . No dimpling, discharge, masses, tenderness or discharge . LN: no cervical axillary inguinal adenopathy CHEST/PULM:  Clear to auscultation and percussion breath sounds equal no wheeze , rales or rhonchi. No chest wall deformities or tenderness. CV: PMI is nondisplaced, S1 S2 no gallops, murmurs, rubs. Peripheral pulses are full without delay.No JVD .  ABDOMEN: Bowel sounds normal  nontender  No guard or rebound, no hepato splenomegal no CVA tenderness.  No hernia. Extremtities:  No clubbing cyanosis , no acute joint swelling or redness no focal atrophy left leg wrapped  Toes:   good perfusion  4 + size compared to 1+ right  NEURO:  Oriented x3, cranial nerves 3-12 appear to be intact, no obvious focal weakness,gait within normal limits no abnormal reflexes or asymmetrical SKIN: No acute rashes normal turgor, color, no bruising or petechiae.  Eczema rash right forearm extensor surface  PSYCH: Oriented, good eye contact, no obvious depression anxiety, cognition and judgment appear normal.   labs reviewed   Lipids could be better  EKG NSR no acute changes normal Assessment & Plan:  Preventive Health Care UTD . Counseled.  LIPIDS: could be better  Counseled LSI Lymphedema Milroys .  Does excellent local care ... rx for comp stockings  No time for wraps for now as she is in school.     Rx given for stockings. And use antibioitc prn. And fu.  Eczema   Per derm. Topical steroids.  Sleep : on ambien some related to menopause HRT:  Doing well  Consider try to warn next year or when possible .

## 2010-05-01 ENCOUNTER — Encounter: Payer: Self-pay | Admitting: Internal Medicine

## 2010-05-01 DIAGNOSIS — L309 Dermatitis, unspecified: Secondary | ICD-10-CM | POA: Insufficient documentation

## 2010-05-06 ENCOUNTER — Telehealth: Payer: Self-pay | Admitting: Internal Medicine

## 2010-05-06 MED ORDER — ESTRADIOL 0.1 MG/24HR TD PTTW: 1.0000 | MEDICATED_PATCH | TRANSDERMAL | Status: DC

## 2010-05-06 NOTE — Telephone Encounter (Signed)
Patient would like a 90 day supply of Vivelle Dot [facility] until mail order arrives.

## 2010-05-06 NOTE — Telephone Encounter (Signed)
Rx sent to pharmacy   

## 2010-05-21 NOTE — Op Note (Signed)
[facility]  Patient:    Lindsay Parks, Lindsay Parks                      MRN: [id number] Proc. Date: 04/29/99 Adm. Date:  [id number] Attending:  Marlowe Kays Page                           Operative Report  PREOPERATIVE DIAGNOSIS:  Painful degenerative arthritis, left acromioclavicular  joint.  POSTOPERATIVE DIAGNOSIS:  Painful degenerative arthritis, left acromioclavicular joint.  OPERATION:  Open resection, distal left clavicle.  SURGEON:  Illene Labrador. [place], M.D.  ASSISTANT:  Nurse.  ANESTHESIA:  General.  PROFOUND JUSTIFICATION OF PROCEDURE:  The patient has had left shoulder pain completely relieved by local injection of the [facility] joint, but has failed to respond to injections with lidocaine and steroid.  On x-ray she has degenerative changes at the joint.  DESCRIPTION OF PROCEDURE:  Satisfactory general anesthesia, beach chair position with folded sheet beneath the left scapula, left shoulder girdle was prepped with DuraPrep and draped in a sterile field, Ioban employed.  Incision was made along the distal clavicle and across the [facility] joint.  The [facility] joint was identified and subperiosteal dissection with cutting cautery I demarcated the lateral 2 cm of he distal clavicle.  I then marked the anticipated of osteotomy of the clavicle with a Bovie and undermined the clavicle at this point and protected these structures ith a curved Krego and hemostat posteriorly.  I used a Micro-saw to amputate the lateral 2 cm of clavicle.  Once the cut through the bone had been completed, I grabbed the fragment with a towel clip and freed up the soft tissue remaining with cutting cautery.  I then removed several small bone spicules in the posterior inferior portion of the remaining clavicle and covered the raw bone with bone wax. The wound was then irrigated withth sterile saline.  The soft tissues were infiltrated wi 0.5% Marcaine with adrenaline.  Gelfoam was  placed filling the gap left by the resection of clavicle, and the fascia periosteal complex was then closed with interrupted #1 Vicryl, subcutaneous tissue with interrupted 3-0 Vicryl and the skin with Steri-Strips.  A dry sterile dressing and shoulder mobilizer ere applied.  She tolerated the procedure well and was taken to recovery in satisfactory condition with no known complications. DD:  04/29/99 TD:  04/30/99 Job: [id number]

## 2010-05-21 NOTE — H&P (Signed)
Lindsay Parks, Lindsay Parks               ACCOUNT NO.:  [id number]   MEDICAL RECORD NO.:  [id number]          PATIENT TYPE:  AMB   LOCATION:  SDC                           FACILITY:  [facility]   PHYSICIAN:  Duke Salvia. Marcelle Overlie, M.D.DATE OF BIRTH:  [birth year]   DATE OF ADMISSION:  DATE OF DISCHARGE:                                HISTORY & PHYSICAL   DATE OF SCHEDULED SURGERY:  October 15, 2003   CHIEF COMPLAINT:  Left lower quadrant pain.   HISTORY OF PRESENT ILLNESS:  A 55 year old G2 P2.  This patient has had a  prior TVH and cholecystectomy.  She was evaluated in September in the ED for  left lower quadrant pain.  A CT was obtained that showed both ovaries were  present.  There were two small cysts on the ovary, but a linear mass that  appeared to be a hydrosalpinx on the left side.  Her pain has actually been  present to some extent for the last 6 months.  She is currently on Estratest  for ERT, presents now for laparoscopy with BSO.  This procedure including  risks of bleeding, infection, adjacent organ injury, the possible need for  open or additional surgery, along with her expected recovery time all  reviewed with her, which she understands and accepts.   PAST MEDICAL HISTORY:  Allergies:  None.  Operations:  Prior shoulder  surgery, cholecystectomy, TVH.  Current medicines:  Ambien p.r.n., Estratest  daily.  Obstetrical history of two vaginal deliveries at term in 1976 and  1978, and a history significant for mother and a sister with colon cancer.   PHYSICAL EXAMINATION:  VITAL SIGNS:  Temperature 98.2, blood pressure  120/70.  HEENT:  Unremarkable.  NECK:  Supple without masses.  LUNGS:  Clear.  CARDIOVASCULAR:  Regular rate and rhythm without murmurs, rubs, gallops  noted.  BREASTS:  Without masses.  ABDOMEN:  Soft, flat, nontender.  PELVIC:  Normal external genitalia, vaginal cuff is clear.  Bimanual  revealed no masses, slightly tender on the left.  EXTREMITIES AND NEUROLOGIC:   Unremarkable.   IMPRESSION:  Chronic left lower quadrant pain, probable hydrosalpinx noted  on recent CT scan.   PLAN:  Diagnostic laparoscopy with BSO, possible open surgery for BSO  discussed as above.  Procedure and risks reviewed as above also.      RMH/MEDQ  D:  10/13/2003  T:  10/13/2003  Job:  [id number]

## 2010-05-21 NOTE — Discharge Summary (Signed)
[name], Lindsay Parks               ACCOUNT NO.:  [id number]   MEDICAL RECORD NO.:  [id number]          PATIENT TYPE:  INP   LOCATION:  9318                          [facility]ACILITY:  [facility]   PHYSICIAN:  Duke Salvia. Marcelle Overlie, M.D.DATE OF BIRTH:  Feb 22, 1955   DATE OF ADMISSION:  10/15/2003  DATE OF DISCHARGE:                                 DISCHARGE SUMMARY   DISCHARGE DIAGNOSES:  1.  Chronic pelvic pain, left hydrosalpinx.  2.  Laparoscopy this admission followed by laparotomy with bilateral      salpingo-oophorectomy and excision of left hydrosalpinx.   SUMMARY OF THE HISTORY AND PHYSICAL EXAMINATION:  Please see admission H&P  for details. Briefly, a 55 year old G2 P2 who has had a prior TVH was seen  in September 2005 in the ED complaining of left lower quadrant pain.  A CT  was done that showed a left hydrosalpinx.  Due to the severity of her pain  she presents now for laparoscopy with BSO and excision of the hydrosalpinx.   HOSPITAL COURSE:  On October 15, 2003 under general anesthesia the patient  underwent laparoscopy.  Due to dense bilateral adnexal adhesions this was  followed by a Pfannenstiel laparotomy with BSO and excision of the left  hydrosalpinx.  On her postoperative day #1 she was afebrile, hemoglobin  11.2.  The incision was clean and dry.  She did have the ON-Q pain catheters  for incisional pain relief also.  The following day - October 14 - she was  tolerating a regular diet, ambulating without difficulty.  Her abdominal  exam was unremarkable, incision was clean and dry.  She was ready for  discharge at that point.   LABORATORY DATA:  Preoperative hemoglobin 15.5, postoperative on October 13  was 11.2.  UA on admission was negative.   DISPOSITION:  The patient discharged on Tylox p.r.n. pain.  She will return  to the office in 3 days to have the staples removed.  Advised to report any  incisional redness or drainage, increased pain or bleeding, or fever over  101.   She was given specific instructions regarding diet, sex, exercise.   CONDITION:  Good.   ACTIVITY:  Graded increase.      RMH/MEDQ  D:  10/17/2003  T:  10/17/2003  Job:  [id number]

## 2010-05-21 NOTE — Op Note (Signed)
NAME:  Lindsay Parks, Lindsay Parks                         ACCOUNT NO.:  [id number]   MEDICAL RECORD NO.:  [id number]                   PATIENT TYPE:  AMB   LOCATION:  DAY                                  FACILITY:  [facility]   PHYSICIAN:  Marlowe Kays, M.D.               DATE OF BIRTH:  [birth year]   DATE OF PROCEDURE:  02/10/2003  DATE OF DISCHARGE:                                 OPERATIVE REPORT   PREOPERATIVE DIAGNOSIS:  Painful nodule dorsum middle phalanx, right ring  finger.   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   OPERATION:  Excision of painful nodule, dorsum right ring finger.   SURGEON:  Illene Labrador. Aplington, M.D.   ASSISTANT:  Nurse.   ANESTHESIA:  IV regional.   PATHOLOGY AND JUSTIFICATION FOR PROCEDURE:  She has had a progressively  painful nodule there now for a number of months.  She is constantly banging  it causing pain.  It is not increasing in size.  It appears to be movable  and is not overlying the joint.  She is having enough symptoms to request  excision today.  See operative description below.   DESCRIPTION OF PROCEDURE:  Satisfactory IV regional anesthesia, DuraPrep  from mid forearm to the fingertips and was draped in a sterile field.  I  marked out a lazy-S incision centered over the nodule and with careful  dissection, after going through the skin, dissected out subcutaneous tissue.  There were several small blood vessels and a 1-2 mm firm nodule present  which was not connected to the extensor tendon which I excised.  It had a  whitish appearance consistent with a neuroma.  One bleeder was coagulated  with the needle tip cautery.  Extensor tendon appeared to be normal  appearance.  After excising this material, I was no longer able to feel the  nodule.  The scrub nurse also was unable to appreciate any residual nodule.  At this point, I felt we had excised the offending nodule.  The wound was  irrigated with  sterile saline and closed with interrupted 5-0 nylon mattress  sutures, and the incision was then infiltrated with 0.5% plain Marcaine and  dressed with Betadine and a light dry sterile dressing.  At the time of this  dictation, she was on her way to the recovery room in satisfactory condition  with no known complications.                                               Marlowe Kays, M.D.    JA/MEDQ  D:  02/10/2003  T:  02/10/2003  Job:  [id number]

## 2010-05-21 NOTE — Op Note (Signed)
Lindsay Parks, BOLDING               ACCOUNT NO.:  [id number]   MEDICAL RECORD NO.:  [id number]          PATIENT TYPE:  AMB   LOCATION:  SDC                           [facility]  [facility]   PHYSICIAN:  Duke Salvia. Marcelle Overlie, M.D.DATE OF BIRTH:  05-20-1955   DATE OF PROCEDURE:  10/15/2003  DATE OF DISCHARGE:                                 OPERATIVE REPORT   PREOPERATIVE DIAGNOSES:  1.  Chronic pelvic pain.  2.  Left hydrosalpinx.   POSTOPERATIVE DIAGNOSES:  1.  Chronic pelvic pain.  2.  Left hydrosalpinx.  3.  Bilateral adnexal adhesions.   PROCEDURE:  Diagnostic laparoscopy, followed by laparotomy with lysis of  adhesions, bilateral salpingo-oophorectomy.   SURGEON:  Duke Salvia. Marcelle Overlie, M.D.   ANESTHESIA:  General endotracheal.   COMPLICATIONS:  None.   DRAINS:  Foley catheter.   ESTIMATED BLOOD LOSS:  150.   SPECIMENS REMOVED:  1.  Bilateral tubes and ovaries.  2.  Anterior abdominal wall skin tag.   PROCEDURE AND FINDINGS:  The patient sent to the operating room.  After an  adequate level of general endotracheal anesthesia was obtained with the  patient's legs in stirrups, the abdomen, perineum, and vagina were prepped  and draped in the usual manner for laparoscopy.  The bladder was drained  with a Foley catheter.  EUA was carried out.  No masses were palpable on  exam.  The subumbilical area was infiltrated with 0.5% Marcaine plain, a  small incision was made, the Veress needle was introduced without  difficulty.  Its intra-abdominal position was verified by pressure and water  testing.  After 2.5 L pneumoperitoneum was then created, a laparoscopic  trocar and sleeve were then introduced without difficulty.  There was no  evidence of any bleeding or trauma.  Three fingerbreadths above the  symphysis in the midline, a 5 mm trocar was inserted under direct  visualization.  The patient placed in Trendelenburg and the pelvic findings  as follows.   The right ovary was  densely adherent to the right pelvic sidewall as was the  left side, which appeared to be fairly normal.  There was a significant  linear hydrosalpinx also noted on that left side, which was adherent to the  sidewall area.  A decision made to proceed with laparotomy and BSO.  Instruments were removed, gas allowed to escape, the upper incision closed  with 4-0 Vicryl Rapide subcuticular.  The legs were placed more supine and a  Pfannenstiel incision was made, carried down to the fascia, which was  incised and extended transversely.  Rectus muscles divided in the midline.  Peritoneum entered superiorly without incident and extended in a vertical  fashion.  An O'Connor-O'Sullivan retractor was positioned, bowel was packed  superiorly out of the field, the patient placed in slight Trendelenburg.  Starting on the right, the right ovary and tube were grasped with the  Babcock clamp.  The right round ligament was clamped and divided, suture  ligated and held with an 0 Vicryl suture.  The retroperitoneal space on that  side was developed with blunt dissection.  The  course of the ureter was well  below the right IP ligament, was then isolated, doubly free-tied with a 0  Vicryl tie, and cut.  The remainder of the ovary after careful dissection  from the sidewall was dissected down to a small remaining pedicle, which was  clamped with a Kelly clamp and excised.  This was free-tied with a 0 Vicryl  tie.  This area was irrigated and noted to be hemostatic.  The course of the  ureter was rechecked and was again well below the operative site.  Then  continuing on the left side, the hydrosalpinx was actually separate from the  tube and ovary.  This was dissected free and Kelly clamps placed across the  base, and this was excised and suture ligated with 0 Vicryl suture.  The  round ligament could be identified then on the left side, which was clamped  and divided.  The retroperitoneal space on that side was  developed with  blunt dissection.  The course of the ureter was noted to be well below the  left IP ligament.  The left IP ligament was then isolated, clamped, and  doubly free-tied with 0 Vicryl suture and cut.  The remainder of the left  ovary was then dissected with sharp and blunt dissection until a single  remaining pedicle could be identified, was clamped with a Kelly clamp near  the ovary, divided, and suture ligated with 0 Vicryl suture.  Both sides  were then irrigated with saline and aspirated.  The operative site was  hemostatic.  Prior to closure the sponge, needle, and instrument counts were  reported as correct x2.  The peritoneum closed with a running 2-0 Vicryl  suture.  Fascia closed transversely with a 0 PDS suture.  Prior to closing  the fascia, the subfascial and subcutaneous On-Q catheters were positioned.  Subcutaneous fat was hemostatic, clips and Steri-Strips used on the skin,  and the On-Q pump catheters were primed and taped down.  She tolerated this  well, went to recovery room in good condition.      RMH/MEDQ  D:  10/15/2003  T:  10/15/2003  Job:  [id number]

## 2010-08-17 ENCOUNTER — Other Ambulatory Visit: Payer: Self-pay | Admitting: Internal Medicine

## 2010-10-14 ENCOUNTER — Telehealth: Payer: Self-pay | Admitting: *Deleted

## 2010-10-14 NOTE — Telephone Encounter (Signed)
Ok  Send order for dexa . Dx osteopenia

## 2010-10-14 NOTE — Telephone Encounter (Signed)
Needs order faxed to [facility].  [phone]

## 2010-10-15 NOTE — Telephone Encounter (Signed)
Rx sent to [facility]

## 2010-10-27 ENCOUNTER — Other Ambulatory Visit: Payer: Self-pay

## 2010-10-27 NOTE — Telephone Encounter (Signed)
rx request for zolpidem tartrate 10 #90. Pls last seen 04/27/10. Pls advise

## 2010-10-28 NOTE — Telephone Encounter (Signed)
Ok to refill for 2 months due for ROV med check please have her schedule  For rov before needs nore refill

## 2010-10-29 MED ORDER — ZOLPIDEM TARTRATE 10 MG PO TABS: 10.0000 mg | ORAL_TABLET | Freq: Every evening | ORAL | Status: DC | PRN

## 2010-10-29 NOTE — Telephone Encounter (Signed)
rx sent in to pharmacy and noted pt needs a rov before refills run out.

## 2010-11-05 ENCOUNTER — Encounter: Payer: Self-pay | Admitting: Internal Medicine

## 2010-11-15 ENCOUNTER — Ambulatory Visit: Payer: Federal, State, Local not specified - PPO | Admitting: Internal Medicine

## 2010-11-16 ENCOUNTER — Ambulatory Visit (INDEPENDENT_AMBULATORY_CARE_PROVIDER_SITE_OTHER): Payer: Federal, State, Local not specified - PPO | Admitting: Internal Medicine

## 2010-11-16 ENCOUNTER — Encounter: Payer: Self-pay | Admitting: Internal Medicine

## 2010-11-16 VITALS — BP 110/80 | HR 78 | Wt 198.0 lb

## 2010-11-16 DIAGNOSIS — M949 Disorder of cartilage, unspecified: Secondary | ICD-10-CM

## 2010-11-16 DIAGNOSIS — N951 Menopausal and female climacteric states: Secondary | ICD-10-CM

## 2010-11-16 DIAGNOSIS — G4700 Insomnia, unspecified: Secondary | ICD-10-CM

## 2010-11-16 NOTE — Assessment & Plan Note (Signed)
dexa scan good  Now -.8 t score   Continue LSI

## 2010-11-16 NOTE — Progress Notes (Signed)
  Subjective:    Patient ID: Lindsay Parks, female    DOB: Apr 28, 1955, 55 y.o.   MRN: [id number]  HPI Patient comes in today for follow up of  multiple medical problems.   Since last visit has had major life stress and obligations but going to school and has a 4.0 still doing well Sleep : ambien   Still helping sleep  8 hours and   Ok. NO hangover.   Leg: no change Allergy  With season change. Estrogen  :Tried to go 2 days  If tries to go down the mood and flushes happens. MOM is in independent living and having TIAs  And  "evil" and irritable. Depressed.    Review of Systems NO fever new ha falling cp sob  Mood stable Past history family history social history reviewed in the electronic medical record.     Objective:   Physical Exam WDWN  in nad. Neck: Supple without adenopathy or masses or bruits Oriented x 3. Normal cognition, attention, speech. Not anxious or depressed appearing   Good eye contact . Reviewed dexa with patient Total visit > 50% spent counseling and coordinating care      Assessment & Plan:  HRT   Has sx when tries to wean  Stay  at this dose for now. Sleep; ok to continue . Osteopenia:    Recent.   dexa show normal. Total visit > 50% spent counseling and coordinating care

## 2010-11-16 NOTE — Assessment & Plan Note (Signed)
Ok to continue call  For refills when due. Then cpx in the spring or as needed

## 2010-11-16 NOTE — Patient Instructions (Addendum)
Continue lifestyle intervention healthy eating and exercise . Call for refills. Ok to continue same regimen Your bone density is good.

## 2010-12-10 ENCOUNTER — Encounter: Payer: Self-pay | Admitting: Internal Medicine

## 2010-12-29 ENCOUNTER — Telehealth: Payer: Self-pay | Admitting: Internal Medicine

## 2010-12-29 NOTE — Telephone Encounter (Signed)
Okay X 2 per Dr. Fabian Sharp.

## 2010-12-29 NOTE — Telephone Encounter (Signed)
Pt called req refill of zolpidem (AMBIEN) 10 MG tablet [facility] [address]

## 2010-12-30 ENCOUNTER — Telehealth: Payer: Self-pay | Admitting: *Deleted

## 2010-12-30 MED ORDER — ZOLPIDEM TARTRATE 10 MG PO TABS: 10.0000 mg | ORAL_TABLET | Freq: Every evening | ORAL | Status: DC | PRN

## 2010-12-30 NOTE — Telephone Encounter (Signed)
See other phone note per Dr. Fabian Sharp ok x 2

## 2011-02-02 ENCOUNTER — Telehealth: Payer: Self-pay | Admitting: Internal Medicine

## 2011-02-02 NOTE — Telephone Encounter (Signed)
Need prior authorization for Zolpidem 10mg  1qhs-----call [phone] option 0.  [facility]

## 2011-03-28 ENCOUNTER — Encounter: Payer: Self-pay | Admitting: Gastroenterology

## 2011-04-05 ENCOUNTER — Telehealth: Payer: Self-pay

## 2011-04-05 NOTE — Telephone Encounter (Signed)
Rx request for zolpidem tartrate 10 mg.  Rx last filled 12/30/10.  Pt last seen 11/16/10.  Pls advise.

## 2011-04-05 NOTE — Telephone Encounter (Signed)
Ok x   90 day refill

## 2011-04-06 MED ORDER — ZOLPIDEM TARTRATE 10 MG PO TABS: 10.0000 mg | ORAL_TABLET | Freq: Every evening | ORAL | Status: DC | PRN

## 2011-04-06 NOTE — Telephone Encounter (Signed)
Rx called in to pharmacy. 

## 2011-04-11 ENCOUNTER — Encounter: Payer: Self-pay | Admitting: Gastroenterology

## 2011-05-02 ENCOUNTER — Other Ambulatory Visit: Payer: Self-pay | Admitting: Internal Medicine

## 2011-05-02 NOTE — Telephone Encounter (Signed)
Pt last seen 11/16/10.  Rx last filled 04/06/11 #90.  Pls advise.

## 2011-05-02 NOTE — Telephone Encounter (Signed)
Pt needs generic ambien 10 mg #90 with 3 refills sent to [facility]

## 2011-05-03 MED ORDER — ZOLPIDEM TARTRATE 10 MG PO TABS: 10.0000 mg | ORAL_TABLET | Freq: Every evening | ORAL | Status: DC | PRN

## 2011-05-03 NOTE — Telephone Encounter (Signed)
Rx sent to pharmacy   

## 2011-05-03 NOTE — Telephone Encounter (Signed)
Ok to refill 90 with 1 refill ( ie 6 months worth.

## 2011-05-09 ENCOUNTER — Encounter: Payer: Self-pay | Admitting: Gastroenterology

## 2011-05-09 ENCOUNTER — Ambulatory Visit (INDEPENDENT_AMBULATORY_CARE_PROVIDER_SITE_OTHER): Payer: Federal, State, Local not specified - PPO | Admitting: Gastroenterology

## 2011-05-09 VITALS — BP 94/60 | HR 80 | Ht 66.5 in | Wt 203.0 lb

## 2011-05-09 DIAGNOSIS — R194 Change in bowel habit: Secondary | ICD-10-CM

## 2011-05-09 DIAGNOSIS — R1031 Right lower quadrant pain: Secondary | ICD-10-CM

## 2011-05-09 DIAGNOSIS — R198 Other specified symptoms and signs involving the digestive system and abdomen: Secondary | ICD-10-CM

## 2011-05-09 MED ORDER — PEG-KCL-NACL-NASULF-NA ASC-C 100 G PO SOLR: 1.0000 | Freq: Once | ORAL | Status: DC

## 2011-05-09 NOTE — Progress Notes (Signed)
History of Present Illness: Lindsay Parks is a pleasant 56 year old white female referred at the request of Dr. Fabian Sharp for evaluation of abdominal pain. She has pain in the right lower quadrant pain that precedes a bowel movement. There has been no change in bowel habits, melena or hematochezia. She's concerned about the pain which is a persistent problem. It is relieved after moving her bowels.   Colonoscopy in 2006 was normal  GI history is pertinent for an esophageal stricture that was dilated in 2001. She underwent ERCP in 2000 because of pancreatitis. Exam was negative.    Past Medical History  Diagnosis Date  . Allergy   . GERD (gastroesophageal reflux disease)     stricture 2001  . Hx of colonic polyps   . Depression   . UTI (lower urinary tract infection)   . 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 Anxiety   . Arthritis   . History of pancreatitis    Past Surgical History  Procedure Date  . Esophagogastroduodenoscopy   . Flexible sigmoidoscopy   . Partial hysterectomy 2005    left hydrosalpinx ovarian cyst  . Colonoscopy   . Total abdominal hysterectomy   . Cholecystectomy    family history includes Arthritis in her mother; Colon cancer in her paternal grandmother; Diabetes in her mother and other; Glaucoma in her father; Gout in her father; Heart disease in her maternal grandmother and mother; Hyperlipidemia in her father and mother; Hypertension in her father and mother; Kidney disease in her paternal uncle; Osteoporosis in her mother; Pancreatic cancer in her paternal grandfather; and Stroke in her mother. Current Outpatient Prescriptions  Medication Sig Dispense Refill  . calcium-vitamin D (OSCAL WITH D) 500-200 MG-UNIT per tablet Take 1 tablet by mouth daily.        . fluocinonide (LIDEX) 0.05 % cream Apply topically 2 (two) times daily.        . hydrOXYzine (ATARAX) 25 MG tablet Take 25 mg by mouth 3 (three) times daily as needed.        Marland Kitchen VIVELLE-DOT  0.1 MG/24HR PLACE 1 PATCH (0.1 MG TOTAL) ONTO THE SKIN 2 (TWO) TIMES A WEEK.  24 each  3  . zolpidem (AMBIEN) 10 MG tablet Take 1 tablet (10 mg total) by mouth at bedtime as needed.  90 tablet  1   Allergies as of 05/09/2011  . (No Known Allergies)    reports that she has never smoked. She has never used smokeless tobacco. She reports that she drinks alcohol. She reports that she does not use illicit drugs.     Review of Systems: Pertinent positive and negative review of systems were noted in the above HPI section. All other review of systems were otherwise negative.  Vital signs were reviewed in today's medical record Physical Exam: General: Well developed , well nourished, no acute distress Head: Normocephalic and atraumatic Eyes:  sclerae anicteric, EOMI Ears: Normal auditory acuity Mouth: No deformity or lesions Neck: Supple, no masses or thyromegaly Lungs: Clear throughout to auscultation Heart: Regular rate and rhythm; no murmurs, rubs or bruits Abdomen: Soft, non tender and non distended. No masses, hepatosplenomegaly or hernias noted. Normal Bowel sounds Rectal:deferred Musculoskeletal: Symmetrical with no gross deformities  Skin: No lesions on visible extremities Pulses:  Normal pulses noted Extremities: No clubbing, cyanosis, or deformities noted; there is marked nonpitting edema of the left lower extremity from the foot to the knee Neurological: Alert oriented x 4, grossly  nonfocal Cervical Nodes:  No significant cervical adenopathy Inguinal Nodes: No significant inguinal adenopathy Psychological:  Alert and cooperative. Normal mood and affect

## 2011-05-09 NOTE — Assessment & Plan Note (Signed)
The patient presents with early constant right lower pain that precedes a bowel movement. Exam is normal and there are no other accompanying symptoms. A structural abnormality of the colon including polyps or neoplasm should be ruled out.  Recommendations #1 colonoscopy

## 2011-05-09 NOTE — Patient Instructions (Signed)
Your Colonoscopy is scheduled on 05/17/2011 at 11am Separate instructions have been given  Colonoscopy A colonoscopy is an exam to evaluate your entire colon. In this exam, your colon is cleansed. A long fiberoptic tube is inserted through your rectum and into your colon. The fiberoptic scope (endoscope) is a long bundle of enclosed and very flexible fibers. These fibers transmit light to the area examined and send images from that area to your caregiver. Discomfort is usually minimal. You may be given a drug to help you sleep (sedative) during or prior to the procedure. This exam helps to detect lumps (tumors), polyps, inflammation, and areas of bleeding. Your caregiver may also take a small piece of tissue (biopsy) that will be examined under a microscope. LET YOUR CAREGIVER KNOW ABOUT:   Allergies to food or medicine.   Medicines taken, including vitamins, herbs, eyedrops, over-the-counter medicines, and creams.   Use of steroids (by mouth or creams).   Previous problems with anesthetics or numbing medicines.   History of bleeding problems or blood clots.   Previous surgery.   Other health problems, including diabetes and kidney problems.   Possibility of pregnancy, if this applies.  BEFORE THE PROCEDURE   A clear liquid diet may be required for 2 days before the exam.   Ask your caregiver about changing or stopping your regular medications.   Liquid injections (enemas) or laxatives may be required.   A large amount of electrolyte solution may be given to you to drink over a short period of time. This solution is used to clean out your colon.   You should be present 60 minutes prior to your procedure or as directed by your caregiver.  AFTER THE PROCEDURE   If you received a sedative or pain relieving medication, you will need to arrange for someone to drive you home.   Occasionally, there is a little blood passed with the first bowel movement. Do not be concerned.  FINDING OUT  THE RESULTS OF YOUR TEST Not all test results are available during your visit. If your test results are not back during the visit, make an appointment with your caregiver to find out the results. Do not assume everything is normal if you have not heard from your caregiver or the medical facility. It is important for you to follow up on all of your test results. HOME CARE INSTRUCTIONS   It is not unusual to pass moderate amounts of gas and experience mild abdominal cramping following the procedure. This is due to air being used to inflate your colon during the exam. Walking or a warm pack on your belly (abdomen) may help.   You may resume all normal meals and activities after sedatives and medicines have worn off.   Only take over-the-counter or prescription medicines for pain, discomfort, or fever as directed by your caregiver. Do not use aspirin or blood thinners if a biopsy was taken. Consult your caregiver for medicine usage if biopsies were taken.  SEEK IMMEDIATE MEDICAL CARE IF:   You have a fever.   You pass large blood clots or fill a toilet with blood following the procedure. This may also occur 10 to 14 days following the procedure. This is more likely if a biopsy was taken.   You develop abdominal pain that keeps getting worse and cannot be relieved with medicine.  Document Released: 12/18/1999 Document Revised: 12/09/2010 Document Reviewed: 08/02/2007 [facility] Patient Information 2012 [place], [place].

## 2011-05-11 ENCOUNTER — Telehealth: Payer: Self-pay | Admitting: Internal Medicine

## 2011-05-11 MED ORDER — ZOLPIDEM TARTRATE 10 MG PO TABS: 10.0000 mg | ORAL_TABLET | Freq: Every evening | ORAL | Status: DC | PRN

## 2011-05-11 NOTE — Telephone Encounter (Signed)
Rx sent to the pharmacy.  Spoke with pt and pt is aware.

## 2011-05-11 NOTE — Telephone Encounter (Signed)
Pt called and said that [facility] never rcvd the script for zolpidem (AMBIEN) 10 MG tablet from 05/03/11. Pt said that she now needs to get this med at local pharmacy[facility] [address] [facility] day supply.  Pt only has 1 pill remaining. Pls call in today.

## 2011-05-13 ENCOUNTER — Telehealth: Payer: Self-pay

## 2011-05-13 NOTE — Telephone Encounter (Signed)
Pt called and states that pt's Ambien was called in and pt was given 30 day supply.  Advised pt that rx was called in Ambien #90 x 1 rf.

## 2011-05-17 ENCOUNTER — Encounter: Payer: Self-pay | Admitting: Gastroenterology

## 2011-05-17 ENCOUNTER — Ambulatory Visit (AMBULATORY_SURGERY_CENTER): Payer: Federal, State, Local not specified - PPO | Admitting: Gastroenterology

## 2011-05-17 VITALS — BP 124/75 | HR 74 | Temp 97.5°F | Resp 18 | Ht 66.5 in | Wt 203.0 lb

## 2011-05-17 DIAGNOSIS — R1031 Right lower quadrant pain: Secondary | ICD-10-CM

## 2011-05-17 HISTORY — PX: COLONOSCOPY: SHX174

## 2011-05-17 MED ORDER — SODIUM CHLORIDE 0.9 % IV SOLN: 500.0000 mL | INTRAVENOUS | Status: DC

## 2011-05-17 MED ORDER — HYOSCYAMINE SULFATE 0.125 MG SL SUBL: 0.2500 mg | SUBLINGUAL_TABLET | SUBLINGUAL | Status: DC | PRN

## 2011-05-17 NOTE — Patient Instructions (Addendum)
Impressions/Recommendations:  Normal colon   Repeat colonoscopy in 10 years.  YOU HAD AN ENDOSCOPIC PROCEDURE TODAY AT THE [facility]: Refer to the procedure report that was given to you for any specific questions about what was found during the examination.  If the procedure report does not answer your questions, please call your gastroenterologist to clarify.  If you requested that your care partner not be given the details of your procedure findings, then the procedure report has been included in a sealed envelope for you to review at your convenience later.  YOU SHOULD EXPECT: Some feelings of bloating in the abdomen. Passage of more gas than usual.  Walking can help get rid of the air that was put into your GI tract during the procedure and reduce the bloating. If you had a lower endoscopy (such as a colonoscopy or flexible sigmoidoscopy) you may notice spotting of blood in your stool or on the toilet paper. If you underwent a bowel prep for your procedure, then you may not have a normal bowel movement for a few days.  DIET: Your first meal following the procedure should be a light meal and then it is ok to progress to your normal diet.  A half-sandwich or bowl of soup is an example of a good first meal.  Heavy or fried foods are harder to digest and may make you feel nauseous or bloated.  Likewise meals heavy in dairy and vegetables can cause extra gas to form and this can also increase the bloating.  Drink plenty of fluids but you should avoid alcoholic beverages for 24 hours.  ACTIVITY: Your care partner should take you home directly after the procedure.  You should plan to take it easy, moving slowly for the rest of the day.  You can resume normal activity the day after the procedure however you should NOT DRIVE or use heavy machinery for 24 hours (because of the sedation medicines used during the test).    SYMPTOMS TO REPORT IMMEDIATELY: A gastroenterologist can be reached at  any hour.  During normal business hours, 8:30 AM to 5:00 PM Monday through Friday, call [phone].  After hours and on weekends, please call the GI answering service at [phone] who will take a message and have the physician on call contact you.   Following lower endoscopy (colonoscopy or flexible sigmoidoscopy):  Excessive amounts of blood in the stool  Significant tenderness or worsening of abdominal pains  Swelling of the abdomen that is new, acute  Fever of 100F or higher  Following upper endoscopy (EGD)  Vomiting of blood or coffee ground material  New chest pain or pain under the shoulder blades  Painful or persistently difficult swallowing  New shortness of breath  Fever of 100F or higher  Black, tarry-looking stools  FOLLOW UP: If any biopsies were taken you will be contacted by phone or by letter within the next 1-3 weeks.  Call your gastroenterologist if you have not heard about the biopsies in 3 weeks.  Our staff will call the home number listed on your records the next business day following your procedure to check on you and address any questions or concerns that you may have at that time regarding the information given to you following your procedure. This is a courtesy call and so if there is no answer at the home number and we have not heard from you through the emergency physician on call, we will assume that you have returned to  your regular daily activities without incident.  SIGNATURES/CONFIDENTIALITY: You and/or your care partner have signed paperwork which will be entered into your electronic medical record.  These signatures attest to the fact that that the information above on your After Visit Summary has been reviewed and is understood.  Full responsibility of the confidentiality of this discharge information lies with you and/or your care-partner.

## 2011-05-17 NOTE — Progress Notes (Signed)
Patient did not have preoperative order for IV antibiotic SSI prophylaxis. (G8918)  Patient did not experience any of the following events: a burn prior to discharge; a fall within the facility; wrong site/side/patient/procedure/implant event; or a hospital transfer or hospital admission upon discharge from the facility. (G8907)  

## 2011-05-17 NOTE — Op Note (Signed)
[facility] [address] [place], [place]  [address]  COLONOSCOPY PROCEDURE REPORT  PATIENT:  Lindsay, Parks  MR#:  [id number] BIRTHDATE:  05-Sep-1955, 55 yrs. old  GENDER:  female ENDOSCOPIST:  Barbette Hair. Arlyce Dice, MD REF. BY: PROCEDURE DATE:  05/17/2011 PROCEDURE:  Diagnostic Colonoscopy ASA CLASS:  Class II INDICATIONS:  Abdominal pain MEDICATIONS:   MAC sedation, administered by CRNA propofol 150mg IV  DESCRIPTION OF PROCEDURE:   After the risks benefits and alternatives of the procedure were thoroughly explained, informed consent was obtained.  Digital rectal exam was performed and revealed no abnormalities.   The LB CF-[id number] endoscope was introduced through the anus and advanced to the cecum, which was identified by both the appendix and ileocecal valve, without limitations.  The quality of the prep was excellent, using MoviPrep.  The instrument was then slowly withdrawn as the colon was fully examined. <<PROCEDUREIMAGES>>  FINDINGS:  A normal appearing cecum, ileocecal valve, and appendiceal orifice were identified. The ascending, hepatic flexure, transverse, splenic flexure, descending, sigmoid colon, and rectum appeared unremarkable (see image1 and image2). Retroflexed views in the rectum revealed no abnormalities.    The time to cecum =  1) 4.0  minutes. The scope was then withdrawn in 1) 6.0  minutes from the cecum and the procedure completed. COMPLICATIONS:  None ENDOSCOPIC IMPRESSION: 1) Normal colon RECOMMENDATIONS:Hyomax as needed  REPEAT EXAM:  In 10 year(s) for Colonoscopy.  ______________________________ Barbette Hair. Arlyce Dice, MD  CC:  [name], MD  n. Rosalie DoctorBarbette Hair. [name] at 05/17/2011 10:54 AM  [name], [phone]

## 2011-05-17 NOTE — Progress Notes (Signed)
The pt tolerated the colonoscopy very well. Maw   

## 2011-05-18 ENCOUNTER — Telehealth: Payer: Self-pay

## 2011-05-18 NOTE — Telephone Encounter (Signed)
  Follow up Call-  Call back number 05/17/2011  Post procedure Call Back phone  # [phone]  Permission to leave phone message Yes     Patient questions:  Do you have a fever, pain , or abdominal swelling? no Pain Score  0 *  Have you tolerated food without any problems? yes  Have you been able to return to your normal activities? yes  Do you have any questions about your discharge instructions: Diet   no Medications  no Follow up visit  no  Do you have questions or concerns about your Care? no  Actions: * If pain score is 4 or above: No action needed, pain <4.

## 2011-09-12 ENCOUNTER — Other Ambulatory Visit: Payer: Self-pay | Admitting: Internal Medicine

## 2011-09-14 ENCOUNTER — Encounter (HOSPITAL_COMMUNITY): Payer: Self-pay | Admitting: *Deleted

## 2011-09-14 ENCOUNTER — Emergency Department (HOSPITAL_COMMUNITY)
Admission: EM | Admit: 2011-09-14 | Discharge: 2011-09-15 | Disposition: A | Discharge status: Home / Self Care | Payer: Federal, State, Local not specified - PPO | Attending: Emergency Medicine | Admitting: Emergency Medicine

## 2011-09-14 DIAGNOSIS — R51 Headache: Secondary | ICD-10-CM | POA: Insufficient documentation

## 2011-09-14 DIAGNOSIS — I890 Lymphedema, not elsewhere classified: Secondary | ICD-10-CM | POA: Insufficient documentation

## 2011-09-14 DIAGNOSIS — M47812 Spondylosis without myelopathy or radiculopathy, cervical region: Secondary | ICD-10-CM | POA: Insufficient documentation

## 2011-09-14 DIAGNOSIS — M542 Cervicalgia: Secondary | ICD-10-CM | POA: Insufficient documentation

## 2011-09-14 DIAGNOSIS — M436 Torticollis: Secondary | ICD-10-CM | POA: Insufficient documentation

## 2011-09-14 HISTORY — DX: Meningitis, unspecified: G03.9

## 2011-09-14 LAB — BASIC METABOLIC PANEL
BUN: 12 mg/dL (ref 6–23)
CO2: 27 mEq/L (ref 19–32)
Glucose, Bld: 95 mg/dL (ref 70–99)
Potassium: 4.5 mEq/L (ref 3.5–5.1)
Sodium: 137 mEq/L (ref 135–145)

## 2011-09-14 LAB — CBC WITH DIFFERENTIAL/PLATELET
Eosinophils Absolute: 0.1 10*3/uL (ref 0.0–0.7)
Eosinophils Relative: 2 % (ref 0–5)
Hemoglobin: 13.8 g/dL (ref 12.0–15.0)
Lymphocytes Relative: 29 % (ref 12–46)
Lymphs Abs: 1.8 10*3/uL (ref 0.7–4.0)
MCH: 29.1 pg (ref 26.0–34.0)
MCV: 85.7 fL (ref 78.0–100.0)
Monocytes Relative: 8 % (ref 3–12)
Neutrophils Relative %: 60 % (ref 43–77)
Platelets: 257 10*3/uL (ref 150–400)
RBC: 4.75 MIL/uL (ref 3.87–5.11)
WBC: 6.2 10*3/uL (ref 4.0–10.5)

## 2011-09-14 MED ORDER — MORPHINE SULFATE 4 MG/ML IJ SOLN
4.0000 mg | Freq: Once | INTRAMUSCULAR | Status: AC
  Administered 2011-09-15: 4 mg via INTRAVENOUS
  Filled 2011-09-14: qty 1

## 2011-09-14 MED ORDER — DIAZEPAM 5 MG/ML IJ SOLN
5.0000 mg | Freq: Once | INTRAMUSCULAR | Status: AC
  Administered 2011-09-15: 5 mg via INTRAVENOUS
  Filled 2011-09-14: qty 2

## 2011-09-14 NOTE — ED Provider Notes (Signed)
History     CSN: [id number]  Arrival date & time 09/14/11  6578   First MD Initiated Contact with Patient 09/14/11 2301      Chief Complaint  Patient presents with  . Neck Pain    (Consider location/radiation/quality/duration/timing/severity/associated sxs/prior treatment) The history is provided by the patient.  Lindsay Parks is a 56 y.o. female hx of depression, arthritis, meningitis here with R sided neck pain. R neck pain x 2 days, gradually worsening. Started with R sided headache that radiate to R neck. This AM, the neck spasms got worse and she is unable to turn her head to the R. No fever or rash or recent travel. She does have a hx of viral meningitis in the past but this time she has no fever. Headache was gradual onset and not sudden. Her mother does have hx of hemorrhagic stroke from aneurysm.    Past Medical History  Diagnosis Date  . GERD (gastroesophageal reflux disease)     stricture 2001  . Hx of colonic polyps   . Depression   . UTI (lower urinary tract infection)   . 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 Anxiety   . Arthritis   . History of pancreatitis   . History of esophageal stricture   . Allergy     SEASONAL  . Cataract   . Meningitis     Past Surgical History  Procedure Date  . Esophagogastroduodenoscopy   . Flexible sigmoidoscopy   . Partial hysterectomy 2005    left hydrosalpinx ovarian cyst  . Colonoscopy   . Total abdominal hysterectomy   . Cholecystectomy   . Shoulder surgery   . Elbow surgery     Family History  Problem Relation Age of Onset  . Stroke Mother   . Diabetes Mother   . Hypertension Mother   . Heart disease Mother   . Osteoporosis Mother   . Arthritis Mother   . Hyperlipidemia Mother   . Colon cancer Paternal Grandmother     aunts and uncles  . Glaucoma Father   . Hypertension Father   . Gout Father   . Hyperlipidemia Father   . Pancreatic cancer Paternal Grandfather     lung, and  stomach  . Diabetes Other     grandson  . Heart disease Maternal Grandmother   . Kidney disease Paternal Uncle     History  Substance Use Topics  . Smoking status: Never Smoker   . Smokeless tobacco: Never Used  . Alcohol Use: Yes     socially    OB History    Grav Para Term Preterm Abortions TAB SAB Ect Mult Living                  Review of Systems  Musculoskeletal:       Neck pain  Neurological: Positive for headaches.  All other systems reviewed and are negative.    Allergies  Review of patient's allergies indicates no known allergies.  Home Medications   Current Outpatient Rx  Name Route Sig Dispense Refill  . CALCIUM CARBONATE-VITAMIN D 500-200 MG-UNIT PO TABS Oral Take 1 tablet by mouth daily.      Marland Kitchen ESTRADIOL 0.1 MG/24HR TD PTTW Transdermal Place 1 patch onto the skin 2 (two) times a week. Apply patch on Wednesday and Sunday    . FLUOCINONIDE 0.05 % EX CREA Topical Apply 1 application topically 2 (two) times daily as needed. For  eczema    . ZOLPIDEM TARTRATE 10 MG PO TABS Oral Take 10 mg by mouth at bedtime.      BP 131/58  Pulse 90  Temp 98.1 F (36.7 C) (Oral)  SpO2 99%  Physical Exam  Nursing note and vitals reviewed. Constitutional: She is oriented to person, place, and time.       Uncomfortable, neck turning to the L.   HENT:  Head: Normocephalic and atraumatic.  Mouth/Throat: Oropharynx is clear and moist.  Eyes: Conjunctivae normal are normal. Pupils are equal, round, and reactive to light.  Neck:       Neck turning to the L, unable to look to right side. + tenderness over R SCM muscle.   Cardiovascular: Normal rate, regular rhythm and normal heart sounds.   Pulmonary/Chest: Effort normal and breath sounds normal.  Abdominal: Soft. Bowel sounds are normal.  Musculoskeletal: Normal range of motion.       L leg swollen (lymphedema chronic as per patient).   Neurological: She is alert and oriented to person, place, and time.       CN 2-12  intact except she has slight dysmetria on the R arm. NL heel to shin and no pronator drift. She has nl rhomberg but unsteady with tandem gait.   Skin: Skin is warm and dry.  Psychiatric: She has a normal mood and affect. Her behavior is normal. Judgment and thought content normal.    ED Course  Procedures (including critical care time)  Labs Reviewed  BASIC METABOLIC PANEL - Abnormal; Notable for the following:    GFR calc non Af Amer 67 (*)     GFR calc Af Amer 78 (*)     All other components within normal limits  CBC WITH DIFFERENTIAL   No results found.   No diagnosis found.    MDM  Lindsay Parks is a 56 y.o. female here with L neck pain and abnormal cerebellar signs. 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 However, given family hx of cerebral aneurysm and abnormal cerebellar signs, will need to do CT head/neck angio to r/o bleeding aneurysm vs stroke. Will give pain meds and reassess.   3:57 AM CT head showed no aneurysm or acute stroke. Labs nl. She felt better and her dysmetria on the R side improved. 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 Will d/c home on valium, NSAIDs and have her f/u with neuro.        Lindsay Canal, MD 09/15/11 [phone]

## 2011-09-14 NOTE — ED Notes (Signed)
Pt woke up with right neck pain a couple of days ago and has been taking aleve and then today started hurting so bad.  No fever.  Pt states last time she had this pain she had meningitis.  Pt given mask to wear.  MAEX4

## 2011-09-15 ENCOUNTER — Emergency Department (HOSPITAL_COMMUNITY): Payer: Federal, State, Local not specified - PPO

## 2011-09-15 ENCOUNTER — Encounter (HOSPITAL_COMMUNITY): Payer: Self-pay | Admitting: Radiology

## 2011-09-15 MED ORDER — IOHEXOL 350 MG/ML SOLN
50.0000 mL | Freq: Once | INTRAVENOUS | Status: AC | PRN
  Administered 2011-09-15: 50 mL via INTRAVENOUS

## 2011-09-15 MED ORDER — DIAZEPAM 5 MG PO TABS: 5.0000 mg | ORAL_TABLET | Freq: Four times a day (QID) | ORAL | Status: AC | PRN

## 2011-10-07 ENCOUNTER — Ambulatory Visit: Payer: Federal, State, Local not specified - PPO | Attending: Diagnostic Neuroimaging | Admitting: Physical Therapy

## 2011-10-07 DIAGNOSIS — M2560 Stiffness of unspecified joint, not elsewhere classified: Secondary | ICD-10-CM | POA: Insufficient documentation

## 2011-10-07 DIAGNOSIS — IMO0001 Reserved for inherently not codable concepts without codable children: Secondary | ICD-10-CM | POA: Insufficient documentation

## 2011-10-14 ENCOUNTER — Other Ambulatory Visit: Payer: Self-pay | Admitting: Internal Medicine

## 2011-10-24 ENCOUNTER — Ambulatory Visit: Payer: Federal, State, Local not specified - PPO | Admitting: Physical Therapy

## 2011-10-26 ENCOUNTER — Ambulatory Visit: Payer: Federal, State, Local not specified - PPO | Admitting: Physical Therapy

## 2011-11-01 ENCOUNTER — Other Ambulatory Visit: Payer: Self-pay | Admitting: Internal Medicine

## 2011-11-01 ENCOUNTER — Ambulatory Visit: Payer: Federal, State, Local not specified - PPO | Admitting: Physical Therapy

## 2011-11-03 ENCOUNTER — Encounter: Payer: Federal, State, Local not specified - PPO | Admitting: Physical Therapy

## 2011-11-09 ENCOUNTER — Ambulatory Visit: Payer: Federal, State, Local not specified - PPO | Attending: Diagnostic Neuroimaging | Admitting: Rehabilitation

## 2011-11-09 DIAGNOSIS — IMO0001 Reserved for inherently not codable concepts without codable children: Secondary | ICD-10-CM | POA: Insufficient documentation

## 2011-11-09 DIAGNOSIS — M2560 Stiffness of unspecified joint, not elsewhere classified: Secondary | ICD-10-CM | POA: Insufficient documentation

## 2011-11-23 ENCOUNTER — Ambulatory Visit: Payer: Federal, State, Local not specified - PPO | Admitting: Physical Therapy

## 2011-12-19 ENCOUNTER — Other Ambulatory Visit: Payer: Self-pay | Admitting: Internal Medicine

## 2012-04-05 ENCOUNTER — Other Ambulatory Visit: Payer: Self-pay | Admitting: Internal Medicine

## 2012-05-07 ENCOUNTER — Telehealth: Payer: Self-pay | Admitting: Internal Medicine

## 2012-05-07 NOTE — Telephone Encounter (Signed)
[facility] Triage Call Report Triage Record Num: [id number] Operator: Terance Ice Patient Name: Lindsay Parks Call Date & Time: 05/05/2012 3:16:22PM Patient Phone: [phone] PCP: Neta Mends. Panosh Patient Gender: Female PCP Fax : [phone] Patient DOB: 1955/05/06 Practice Name: [facility] Reason for Call: Caller: [place]/Patient; PCP: Berniece Andreas (Family Practice); CB#: [phone]; Call regarding Cuts, Scrapes, Abrasions; hit both middle toes on left foot on woodwork; cannot tell if skin is broken but has wrapped two toes together and elevated foot; has lymph edema and wants to start antibiotic now; spoke with on call provider Dr. Kelle Darting - order cephalexin (Keflex) 500 mg take 2 BID PO until sees MD on 05/08/12, #10 with no refills; advise to call back if fever or other indicators of infection prior to appt; Guideline: No Guideline - Advice Per Reference with disposition of call provider within 24 hours for call provider within 24 hours; Appt? already has appt on 05/08/12. Protocol(s) Used: No Guideline - Advice Per Reference (Adult) Recommended Outcome per Protocol: Call Provider within 24 Hours Reason for Outcome: CALL PROVIDER WITHIN 24 HOURS Care Advice: ~ 05/

## 2012-05-08 ENCOUNTER — Ambulatory Visit (INDEPENDENT_AMBULATORY_CARE_PROVIDER_SITE_OTHER): Payer: Federal, State, Local not specified - PPO | Admitting: Internal Medicine

## 2012-05-08 ENCOUNTER — Other Ambulatory Visit: Payer: Self-pay | Admitting: Internal Medicine

## 2012-05-08 ENCOUNTER — Encounter: Payer: Self-pay | Admitting: Internal Medicine

## 2012-05-08 VITALS — BP 120/84 | HR 88 | Temp 97.8°F | Wt 206.0 lb

## 2012-05-08 DIAGNOSIS — M5090 Cervical disc disorder, unspecified, unspecified cervical region: Secondary | ICD-10-CM | POA: Insufficient documentation

## 2012-05-08 DIAGNOSIS — Q820 Hereditary lymphedema: Secondary | ICD-10-CM

## 2012-05-08 DIAGNOSIS — R609 Edema, unspecified: Secondary | ICD-10-CM | POA: Insufficient documentation

## 2012-05-08 DIAGNOSIS — G4700 Insomnia, unspecified: Secondary | ICD-10-CM

## 2012-05-08 MED ORDER — CEPHALEXIN 500 MG PO CAPS: ORAL_CAPSULE | ORAL | Status: DC

## 2012-05-08 MED ORDER — FUROSEMIDE 20 MG PO TABS: 20.0000 mg | ORAL_TABLET | Freq: Every day | ORAL | Status: DC

## 2012-05-08 MED ORDER — POTASSIUM CHLORIDE ER 10 MEQ PO TBCR: 20.0000 meq | EXTENDED_RELEASE_TABLET | Freq: Two times a day (BID) | ORAL | Status: DC

## 2012-05-08 MED ORDER — POTASSIUM CHLORIDE ER 10 MEQ PO TBCR: 10.0000 meq | EXTENDED_RELEASE_TABLET | Freq: Two times a day (BID) | ORAL | Status: DC

## 2012-05-08 NOTE — Patient Instructions (Signed)
Ok to begin trial of lasix  And potassium supplement Then plan labs and fu in 3-4 weeks  Or as needed. And then fu .

## 2012-05-08 NOTE — Progress Notes (Signed)
Chief Complaint  Patient presents with  . Follow-up    HPI: Patient comes in today for medical problems.  Last  ov was 11 12   Since that time : has had a work up for neck pain  And sponduylsois and pt and tens.   Unit. Using also Aleve 1q d.  Hormone replacement therapy Now using the patch for a week. Since oictober ., Decreased dose. No untoward side effect  Sleep Ambien   10   Mg  Per day. As before Lymphedema had hit her left great toe recently but now is no longer warm and doesn't think it's broken. Uses Keflex prophylaxis when she clips are toenails no recent infection. Uses the wrap right lower extremities beginning to swell again mostly in the foot in the remote past Lasix at helped her some but she had some side effects without monitoring. Thinks that she's getting fluid retention even though she is decreasing the sodium eating well. Would like to try diuretic again. ROS: See pertinent positives and negatives per HPI. No chest pain shortness of breath other major changes have some arthritis in her thumbs sometimes it is hard to get on her compression stockings and wraps  Past Medical History  Diagnosis Date  . GERD (gastroesophageal reflux disease)     stricture 2001  . Hx of colonic polyps   . Depression   . UTI (lower urinary tract infection)   . 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 Anxiety   . Arthritis   . History of pancreatitis   . History of esophageal stricture   . Allergy     SEASONAL  . Cataract   . Meningitis     Family History  Problem Relation Age of Onset  . Stroke Mother   . Diabetes Mother   . Hypertension Mother   . Heart disease Mother   . Osteoporosis Mother   . Arthritis Mother   . Hyperlipidemia Mother   . Colon cancer Paternal Grandmother     aunts and uncles  . Glaucoma Father   . Hypertension Father   . Gout Father   . Hyperlipidemia Father   . Pancreatic cancer Paternal Grandfather     lung, and stomach  .  Diabetes Other     grandson  . Heart disease Maternal Grandmother   . Kidney disease Paternal Uncle     History   Social History  . Marital Status: Married    Spouse Name: N/A    Number of Children: 2  . Years of Education: N/A   Occupational History  . retired    Social History Main Topics  . Smoking status: Never Smoker   . Smokeless tobacco: Never Used  . Alcohol Use: Yes     Comment: socially  . Drug Use: No  . Sexually Active: None   Other Topics Concern  . None   Social History Narrative   Regular exercise- yes   Occupation: retired from [employer]   Married husband with parkinson's   2 children   Kayren Eaves back to school for [employer]     Outpatient Encounter Prescriptions as of 05/08/2012  Medication Sig Dispense Refill  . calcium-vitamin D (OSCAL WITH D) 500-200 MG-UNIT per tablet Take 1 tablet by mouth daily.        . cephALEXin (KEFLEX) 500 MG capsule TAKE ONE CAPSULE BY MOUTH 3 TIMES A DAY as directed  30 capsule  0  .  fluocinonide (LIDEX) 0.05 % cream Apply 1 application topically 2 (two) times daily as needed. For eczema      . VIVELLE-DOT 0.1 MG/24HR APPLY 1 PATCH TO SKIN TWICE A WEEK  8 patch  0  . zolpidem (AMBIEN) 10 MG tablet TAKE 1 TABLET AT BEDTIME  90 tablet  1  . [DISCONTINUED] cephALEXin (KEFLEX) 500 MG capsule TAKE ONE CAPSULE BY MOUTH 3 TIMES A DAY  30 capsule  0  . furosemide (LASIX) 20 MG tablet Take 1 tablet (20 mg total) by mouth daily.  30 tablet  3  . potassium chloride (K-DUR) 10 MEQ tablet Take 1 tablet (10 mEq total) by mouth 2 (two) times daily.  60 tablet  1  . [DISCONTINUED] estradiol (VIVELLE-DOT) 0.1 MG/24HR Place 1 patch onto the skin 2 (two) times a week. Apply patch on Wednesday and Sunday      . [DISCONTINUED] potassium chloride (K-DUR) 10 MEQ tablet Take 2 tablets (20 mEq total) by mouth 2 (two) times daily.  60 tablet  1   Facility-Administered Encounter Medications as of 05/08/2012  Medication Dose Route Frequency  Provider Last Rate Last Dose  . 0.9 %  sodium chloride infusion  500 mL Intravenous Continuous Louis Meckel, MD        EXAM:  BP 120/84  Pulse 88  Temp(Src) 97.8 F (36.6 C) (Oral)  Wt 206 lb (93.441 kg)  BMI 32.76 kg/m2  SpO2 98%  Body mass index is 32.76 kg/(m^2).  GENERAL: vitals reviewed and listed above, alert, oriented, appears well hydrated and in no acute distress  HEENT: atraumatic, conjunctiva  clear, no obvious abnormalities on inspection of external nose and ears OP : no lesion edema or exudate   NECK: no obvious masses on inspection palpation no adenopathy  LUNGS: clear to auscultation bilaterally, no wheezes, rales or rhonchi, good air movement  CV: HRRR, no gallops or murmurs no clubbing cyanosis nl cap refill  Left lower extremity with lymphedema skin intact toenails in toes with no acute findings or break in the skin. Right lower extremity foot has +1 edema and trace edema in the leg. No redness or streaking MS: moves all extremities  PSYCH: pleasant and cooperative, no obvious depression or anxiety Lab Results  Component Value Date   WBC 6.2 09/14/2011   HGB 13.8 09/14/2011   HCT 40.7 09/14/2011   PLT 257 09/14/2011   GLUCOSE 95 09/14/2011   CHOL 230* 04/20/2010   TRIG 65.0 04/20/2010   HDL 65.10 04/20/2010   LDLDIRECT 149.1 04/20/2010   ALT 13 04/20/2010   AST 17 04/20/2010   NA 137 09/14/2011   K 4.5 09/14/2011   CL 102 09/14/2011   CREATININE 0.93 09/14/2011   BUN 12 09/14/2011   CO2 27 09/14/2011   TSH 1.77 04/20/2010    ASSESSMENT AND PLAN:  Discussed the following assessment and plan:  Edema - Reasonable to try a diuretic for the right lower extremity swelling patient is on once a day Aleve renal function normal within the last 6 months  INSOMNIA -  ongoing Ambien risk-benefit discussed okay to continue refilling  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  Patient Instructions  Ok to begin trial of lasix  And potassium supplement Then plan  labs and fu in 3-4 weeks  Or as needed. And then fu .    Neta Mends. Jasmeen Fritsch M.D.

## 2012-05-10 NOTE — Telephone Encounter (Signed)
Ambien last filled on 11/01/11 #90 with 1 additional refill Last seen on 05/08/12 and has a follow up on 06/18/12 Please advise.  Thanks!!

## 2012-05-11 NOTE — Telephone Encounter (Signed)
Ok to refill x 6 months  As requested

## 2012-06-11 ENCOUNTER — Other Ambulatory Visit (INDEPENDENT_AMBULATORY_CARE_PROVIDER_SITE_OTHER): Payer: Federal, State, Local not specified - PPO

## 2012-06-11 DIAGNOSIS — R635 Abnormal weight gain: Secondary | ICD-10-CM

## 2012-06-11 DIAGNOSIS — E785 Hyperlipidemia, unspecified: Secondary | ICD-10-CM

## 2012-06-11 LAB — HEPATIC FUNCTION PANEL
ALT: 15 U/L (ref 0–35)
AST: 18 U/L (ref 0–37)
Alkaline Phosphatase: 66 U/L (ref 39–117)
Bilirubin, Direct: 0 mg/dL (ref 0.0–0.3)
Total Bilirubin: 0.6 mg/dL (ref 0.3–1.2)

## 2012-06-11 LAB — BASIC METABOLIC PANEL
BUN: 9 mg/dL (ref 6–23)
CO2: 26 mEq/L (ref 19–32)
Calcium: 9.1 mg/dL (ref 8.4–10.5)
Chloride: 107 mEq/L (ref 96–112)
Creatinine, Ser: 1.1 mg/dL (ref 0.4–1.2)
Glucose, Bld: 95 mg/dL (ref 70–99)

## 2012-06-11 LAB — LIPID PANEL
Total CHOL/HDL Ratio: 4
Triglycerides: 57 mg/dL (ref 0.0–149.0)

## 2012-06-11 LAB — TSH: TSH: 0.96 u[IU]/mL (ref 0.35–5.50)

## 2012-06-18 ENCOUNTER — Ambulatory Visit (INDEPENDENT_AMBULATORY_CARE_PROVIDER_SITE_OTHER): Payer: Federal, State, Local not specified - PPO | Admitting: Internal Medicine

## 2012-06-18 ENCOUNTER — Encounter: Payer: Self-pay | Admitting: Internal Medicine

## 2012-06-18 VITALS — BP 118/80 | HR 91 | Temp 98.3°F | Wt 202.0 lb

## 2012-06-18 DIAGNOSIS — E785 Hyperlipidemia, unspecified: Secondary | ICD-10-CM

## 2012-06-18 DIAGNOSIS — R609 Edema, unspecified: Secondary | ICD-10-CM

## 2012-06-18 DIAGNOSIS — I890 Lymphedema, not elsewhere classified: Secondary | ICD-10-CM

## 2012-06-18 DIAGNOSIS — Q820 Hereditary lymphedema: Secondary | ICD-10-CM

## 2012-06-18 NOTE — Patient Instructions (Addendum)
Continue lifestyle intervention healthy eating and exercise .  Can stay on lasix and potassium for now    Take potassium on days you take the lasix.   Avoid late night snacks if possible.   Every t hibng else is good   6 months ROV

## 2012-06-18 NOTE — Progress Notes (Signed)
Chief Complaint  Patient presents with  . Follow-up    Labs    HPI:  Fu edema med change  Evaluation etc  she states that her swelling is down and she's trying to eat healthy lifestyle thinks it could be better. Is finishing up school work. Studying a lot tends to snack at night. This. No unusual chest pain shortness of breath recent infections. ROS: See pertinent positives and negatives per HPI.  Past Medical History  Diagnosis Date  . GERD (gastroesophageal reflux disease)     stricture 2001  . Hx of colonic polyps   . Depression   . UTI (lower urinary tract infection)   . 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 Anxiety   . Arthritis   . History of pancreatitis   . History of esophageal stricture   . Allergy     SEASONAL  . Cataract   . Meningitis     Family History  Problem Relation Age of Onset  . Stroke Mother   . Diabetes Mother   . Hypertension Mother   . Heart disease Mother   . Osteoporosis Mother   . Arthritis Mother   . Hyperlipidemia Mother   . Colon cancer Paternal Grandmother     aunts and uncles  . Glaucoma Father   . Hypertension Father   . Gout Father   . Hyperlipidemia Father   . Pancreatic cancer Paternal Grandfather     lung, and stomach  . Diabetes Other     grandson  . Heart disease Maternal Grandmother   . Kidney disease Paternal Uncle     History   Social History  . Marital Status: Married    Spouse Name: N/A    Number of Children: 2  . Years of Education: N/A   Occupational History  . retired    Social History Main Topics  . Smoking status: Never Smoker   . Smokeless tobacco: Never Used  . Alcohol Use: Yes     Comment: socially  . Drug Use: No  . Sexually Active: None   Other Topics Concern  . None   Social History Narrative   Regular exercise- yes   Occupation: retired from [employer]   Married husband with parkinson's   2 children   Kayren Eaves back to school for advocacy [employer]       Finishes  degree in December .     mArch in [place]     Outpatient Encounter Prescriptions as of 06/18/2012  Medication Sig Dispense Refill  . calcium-vitamin D (OSCAL WITH D) 500-200 MG-UNIT per tablet Take 1 tablet by mouth daily.        . fluocinonide (LIDEX) 0.05 % cream Apply 1 application topically 2 (two) times daily as needed. For eczema      . furosemide (LASIX) 20 MG tablet Take 1 tablet (20 mg total) by mouth daily.  30 tablet  3  . potassium chloride (K-DUR) 10 MEQ tablet Take 1 tablet (10 mEq total) by mouth 2 (two) times daily.  60 tablet  1  . VIVELLE-DOT 0.1 MG/24HR APPLY 1 PATCH TO SKIN TWICE A WEEK  8 patch  5  . zolpidem (AMBIEN) 10 MG tablet TAKE 1 TABLET BY MOUTH AT BEDTIME  90 tablet  1  . [DISCONTINUED] cephALEXin (KEFLEX) 500 MG capsule TAKE ONE CAPSULE BY MOUTH 3 TIMES A DAY as directed  30 capsule  0  . [DISCONTINUED] 0.9 %  sodium chloride infusion        No facility-administered encounter medications on file as of 06/18/2012.    EXAM:  BP 118/80  Pulse 91  Temp(Src) 98.3 F (36.8 C) (Oral)  Wt 202 lb (91.627 kg)  BMI 32.12 kg/m2  SpO2 98%  Body mass index is 32.12 kg/(m^2). Wt Readings from Last 3 Encounters:  06/18/12 202 lb (91.627 kg)  05/08/12 206 lb (93.441 kg)  05/17/11 203 lb (92.08 kg)    GENERAL: vitals reviewed and listed above, alert, oriented, appears well hydrated and in no acute distress  HEENT: atraumatic, conjunctiva  clear, no obvious abnormalities on inspection of external nose and ears NECK: no obvious masses on inspection palpation   legst  In compression right  Minimal edema left wrapped  MS: moves all extremities without noticeable focal  abnormality  PSYCH: pleasant and cooperative, no obvious depression or anxiety Lab Results  Component Value Date   WBC 6.2 09/14/2011   HGB 13.8 09/14/2011   HCT 40.7 09/14/2011   PLT 257 09/14/2011   GLUCOSE 95 06/11/2012   CHOL 208* 06/11/2012   TRIG 57.0 06/11/2012   HDL 54.60 06/11/2012   LDLDIRECT 149.3  06/11/2012   ALT 15 06/11/2012   AST 18 06/11/2012   NA 142 06/11/2012   K 4.4 06/11/2012   CL 107 06/11/2012   CREATININE 1.1 06/11/2012   BUN 9 06/11/2012   CO2 26 06/11/2012   TSH 0.96 06/11/2012    ASSESSMENT AND PLAN:  Discussed the following assessment and plan:  Edema - IMPROVED  labs stable  continue sx rx  for now   LYMPHEDEMA  MILROY'S DISEASE  HYPERLIPIDEMIA  -Patient advised to return or notify health care team  if symptoms worsen or persist or new concerns arise.  Patient Instructions  Continue lifestyle intervention healthy eating and exercise .  Can stay on lasix and potassium for now    Take potassium on days you take the lasix.   Avoid late night snacks if possible.   Every t hibng else is good   6 months ROV      Wanda K. Panosh M.D.

## 2012-11-05 ENCOUNTER — Encounter: Payer: Self-pay | Admitting: Internal Medicine

## 2012-11-09 ENCOUNTER — Other Ambulatory Visit: Payer: Self-pay | Admitting: Internal Medicine

## 2012-11-14 ENCOUNTER — Other Ambulatory Visit: Payer: Self-pay | Admitting: Internal Medicine

## 2012-11-19 ENCOUNTER — Other Ambulatory Visit: Payer: Self-pay | Admitting: Internal Medicine

## 2012-12-11 ENCOUNTER — Ambulatory Visit (INDEPENDENT_AMBULATORY_CARE_PROVIDER_SITE_OTHER): Payer: Federal, State, Local not specified - PPO | Admitting: Internal Medicine

## 2012-12-11 ENCOUNTER — Encounter: Payer: Self-pay | Admitting: Internal Medicine

## 2012-12-11 VITALS — BP 126/84 | HR 81 | Temp 98.2°F | Wt 211.0 lb

## 2012-12-11 DIAGNOSIS — Z9289 Personal history of other medical treatment: Secondary | ICD-10-CM

## 2012-12-11 DIAGNOSIS — Z6379 Other stressful life events affecting family and household: Secondary | ICD-10-CM

## 2012-12-11 DIAGNOSIS — N951 Menopausal and female climacteric states: Secondary | ICD-10-CM

## 2012-12-11 DIAGNOSIS — I890 Lymphedema, not elsewhere classified: Secondary | ICD-10-CM

## 2012-12-11 DIAGNOSIS — G4700 Insomnia, unspecified: Secondary | ICD-10-CM

## 2012-12-11 DIAGNOSIS — Z9189 Other specified personal risk factors, not elsewhere classified: Secondary | ICD-10-CM

## 2012-12-11 MED ORDER — ZOLPIDEM TARTRATE 10 MG PO TABS: ORAL_TABLET | ORAL | Status: DC

## 2012-12-11 MED ORDER — CEPHALEXIN 500 MG PO CAPS: 500.0000 mg | ORAL_CAPSULE | Freq: Three times a day (TID) | ORAL | Status: DC

## 2012-12-11 NOTE — Patient Instructions (Signed)
Consider wean  HRT    When things more stable.   cpx and labs in about 6 months or OV  Or as needed

## 2012-12-11 NOTE — Progress Notes (Signed)
Chief Complaint  Patient presents with  . Follow-up    HPI: Patient comes in today for follow up of  multiple medical problems.  Edema using lasix and potassium so stopped cause not at home   Parents sick and has to go to doctors office .  ambien  Helping  Is only child dto help. Father has prostate cancer that is felt to be metastatic lives by himself doesn't want any treatment or help she is the only confidant and caretaker Sleep still problematic when she gets a chance the Ambien is quite helpful. Ext ending  hrt to about once a week. Been able to go down under this stressful time. Needed refill for the cephalexin in case needed hasn't had a recent infection but is out and having a hard time getting a refill. Pharmacy apparently faxed multiple requests and consent and electronic. She had a bone density here to review. Stop using the Lasix and the potassium because it didn't do what he needed to do and she had to travel so much couldn't take it at those times.  ROS: See pertinent positives and negatives per HPI. No current chest pain shortness of breath has gained some weight possibly from stress and caretaking.  Past Medical History  Diagnosis Date  . GERD (gastroesophageal reflux disease)     stricture 2001  . Hx of colonic polyps   . Depression   . UTI (lower urinary tract infection)   . 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 Anxiety   . Arthritis   . History of pancreatitis   . History of esophageal stricture   . Allergy     SEASONAL  . Cataract   . Meningitis     Family History  Problem Relation Age of Onset  . Stroke Mother   . Diabetes Mother   . Hypertension Mother   . Heart disease Mother   . Osteoporosis Mother   . Arthritis Mother   . Hyperlipidemia Mother   . Colon cancer Paternal Grandmother     aunts and uncles  . Glaucoma Father   . Hypertension Father   . Gout Father   . Hyperlipidemia Father   . Pancreatic cancer Paternal  Grandfather     lung, and stomach  . Diabetes Other     grandson  . Heart disease Maternal Grandmother   . Kidney disease Paternal Uncle     History   Social History  . Marital Status: Married    Spouse Name: N/A    Number of Children: 2  . Years of Education: N/A   Occupational History  . retired    Social History Main Topics  . Smoking status: Never Smoker   . Smokeless tobacco: Never Used  . Alcohol Use: Yes     Comment: socially  . Drug Use: No  . Sexual Activity: None   Other Topics Concern  . None   Social History Narrative   Regular exercise- yes   Occupation: retired from [employer]   Married husband with parkinson's   2 children   Kayren Eaves back to school for advocacy [employer]       Finishes degree in December .     mArch in [place]     Outpatient Encounter Prescriptions as of 12/11/2012  Medication Sig  . calcium-vitamin D (OSCAL WITH D) 500-200 MG-UNIT per tablet Take 1 tablet by mouth daily.    Marland Kitchen estradiol (VIVELLE-DOT) 0.1 MG/24HR patch  Place 1 patch (0.1 mg total) onto the skin 2 (two) times a week.  . fluocinonide (LIDEX) 0.05 % cream Apply 1 application topically 2 (two) times daily as needed. For eczema  . zolpidem (AMBIEN) 10 MG tablet TAKE 1 TABLET BY MOUTH AT BEDTIME  . [DISCONTINUED] zolpidem (AMBIEN) 10 MG tablet TAKE 1 TABLET BY MOUTH AT BEDTIME  . cephALEXin (KEFLEX) 500 MG capsule Take 1 capsule (500 mg total) by mouth 3 (three) times daily. If needed for skin infection.  . [DISCONTINUED] furosemide (LASIX) 20 MG tablet Take 1 tablet (20 mg total) by mouth daily.  . [DISCONTINUED] potassium chloride (K-DUR) 10 MEQ tablet Take 1 tablet (10 mEq total) by mouth 2 (two) times daily.    EXAM:  BP 126/84  Pulse 81  Temp(Src) 98.2 F (36.8 C) (Oral)  Wt 211 lb (95.709 kg)  SpO2 99%  Body mass index is 33.55 kg/(m^2).  GENERAL: vitals reviewed and listed above, alert, oriented, appears well hydrated and in no acute distress  HEENT:  atraumatic, conjunctiva  clear, no obvious abnormalities on inspection of external nose and ears  NECK: no obvious masses on inspection palpation CV: HRRR, no clubbing cyanosisnl cap refill  lwgs in stockings  MS: moves all extremities without noticeable focal  Abnormality nl agit  PSYCH: pleasant and cooperative, no obvious depression or anxiety.cog  Lab Results  Component Value Date   WBC 6.2 09/14/2011   HGB 13.8 09/14/2011   HCT 40.7 09/14/2011   PLT 257 09/14/2011   GLUCOSE 95 06/11/2012   CHOL 208* 06/11/2012   TRIG 57.0 06/11/2012   HDL 54.60 06/11/2012   LDLDIRECT 149.3 06/11/2012   ALT 15 06/11/2012   AST 18 06/11/2012   NA 142 06/11/2012   K 4.4 06/11/2012   CL 107 06/11/2012   CREATININE 1.1 06/11/2012   BUN 9 06/11/2012   CO2 26 06/11/2012   TSH 0.96 06/11/2012   Bone density done October 30 showed LS spine -0.7 consistent with the normal range. Crack score hip 0.1% major fracture 4.9% ASSESSMENT AND PLAN:  Discussed the following assessment and plan:  INSOMNIA - Benefit more than risk of medication refill  LYMPHEDEMA - Milroy disease stable prescription  Keflex to use if needed. Continued compression  Menopausal hot flushes - Reasonable to stay at same dose while she is going through stressful caretaking  Stress due to illness of family member Total visit > 50% spent counseling and coordinating care     -Patient advised to return or notify health care team  if symptoms worsen or persist or new concerns arise.  Patient Instructions  Consider wean  HRT    When things more stable.   cpx and labs in about 6 months or OV  Or as needed     Burna Mortimer K. Claudius Mich M.D.  Pre visit review using our clinic review tool, if applicable. No additional management support is needed unless otherwise documented below in the visit note.

## 2012-12-12 DIAGNOSIS — Z6379 Other stressful life events affecting family and household: Secondary | ICD-10-CM | POA: Insufficient documentation

## 2012-12-12 DIAGNOSIS — Z9289 Personal history of other medical treatment: Secondary | ICD-10-CM | POA: Insufficient documentation

## 2012-12-18 ENCOUNTER — Ambulatory Visit: Payer: Federal, State, Local not specified - PPO | Admitting: Internal Medicine

## 2012-12-31 ENCOUNTER — Encounter: Payer: Self-pay | Admitting: Internal Medicine

## 2013-06-04 ENCOUNTER — Other Ambulatory Visit (INDEPENDENT_AMBULATORY_CARE_PROVIDER_SITE_OTHER): Payer: Federal, State, Local not specified - PPO

## 2013-06-04 DIAGNOSIS — Z0000 Encounter for general adult medical examination without abnormal findings: Secondary | ICD-10-CM

## 2013-06-04 LAB — CBC WITH DIFFERENTIAL/PLATELET
BASOS ABS: 0 10*3/uL (ref 0.0–0.1)
Basophils Relative: 0.5 % (ref 0.0–3.0)
EOS ABS: 0.2 10*3/uL (ref 0.0–0.7)
Eosinophils Relative: 4.3 % (ref 0.0–5.0)
HEMATOCRIT: 40.1 % (ref 36.0–46.0)
Hemoglobin: 13.3 g/dL (ref 12.0–15.0)
LYMPHS ABS: 1.4 10*3/uL (ref 0.7–4.0)
LYMPHS PCT: 25.4 % (ref 12.0–46.0)
MCHC: 33.2 g/dL (ref 30.0–36.0)
MCV: 87.6 fl (ref 78.0–100.0)
MONOS PCT: 5.7 % (ref 3.0–12.0)
Monocytes Absolute: 0.3 10*3/uL (ref 0.1–1.0)
Neutro Abs: 3.5 10*3/uL (ref 1.4–7.7)
Neutrophils Relative %: 64.1 % (ref 43.0–77.0)
PLATELETS: 261 10*3/uL (ref 150.0–400.0)
RBC: 4.57 Mil/uL (ref 3.87–5.11)
RDW: 14 % (ref 11.5–15.5)
WBC: 5.5 10*3/uL (ref 4.0–10.5)

## 2013-06-04 LAB — BASIC METABOLIC PANEL
BUN: 10 mg/dL (ref 6–23)
CALCIUM: 9.3 mg/dL (ref 8.4–10.5)
CO2: 27 meq/L (ref 19–32)
Chloride: 103 mEq/L (ref 96–112)
Creatinine, Ser: 1 mg/dL (ref 0.4–1.2)
GFR: 71.65 mL/min (ref >60.00)
Glucose, Bld: 88 mg/dL (ref 70–99)
Potassium: 4 mEq/L (ref 3.5–5.1)
SODIUM: 138 meq/L (ref 135–145)

## 2013-06-04 LAB — LIPID PANEL
CHOL/HDL RATIO: 4
CHOLESTEROL: 228 mg/dL — AB (ref 0–200)
HDL: 57.2 mg/dL (ref >39.00)
LDL Cholesterol: 158 mg/dL — ABNORMAL HIGH (ref 0–99)
TRIGLYCERIDES: 63 mg/dL (ref 0.0–149.0)
VLDL: 12.6 mg/dL (ref 0.0–40.0)

## 2013-06-04 LAB — HEPATIC FUNCTION PANEL
ALBUMIN: 3.7 g/dL (ref 3.5–5.2)
ALK PHOS: 68 U/L (ref 39–117)
ALT: 15 U/L (ref 0–35)
AST: 15 U/L (ref 0–37)
Bilirubin, Direct: 0 mg/dL (ref 0.0–0.3)
TOTAL PROTEIN: 6.6 g/dL (ref 6.0–8.3)
Total Bilirubin: 0.5 mg/dL (ref 0.2–1.2)

## 2013-06-04 LAB — TSH: TSH: 1.1 u[IU]/mL (ref 0.35–4.50)

## 2013-06-11 ENCOUNTER — Ambulatory Visit (INDEPENDENT_AMBULATORY_CARE_PROVIDER_SITE_OTHER): Payer: Federal, State, Local not specified - PPO | Admitting: Internal Medicine

## 2013-06-11 ENCOUNTER — Encounter: Payer: Self-pay | Admitting: Internal Medicine

## 2013-06-11 VITALS — BP 114/70 | Temp 98.9°F | Ht 65.75 in | Wt 209.0 lb

## 2013-06-11 DIAGNOSIS — Q820 Hereditary lymphedema: Secondary | ICD-10-CM

## 2013-06-11 DIAGNOSIS — M953 Acquired deformity of neck: Secondary | ICD-10-CM

## 2013-06-11 DIAGNOSIS — M7660 Achilles tendinitis, unspecified leg: Secondary | ICD-10-CM

## 2013-06-11 DIAGNOSIS — G4700 Insomnia, unspecified: Secondary | ICD-10-CM

## 2013-06-11 DIAGNOSIS — Z79899 Other long term (current) drug therapy: Secondary | ICD-10-CM

## 2013-06-11 DIAGNOSIS — E785 Hyperlipidemia, unspecified: Secondary | ICD-10-CM

## 2013-06-11 DIAGNOSIS — M5090 Cervical disc disorder, unspecified, unspecified cervical region: Secondary | ICD-10-CM

## 2013-06-11 DIAGNOSIS — Z0000 Encounter for general adult medical examination without abnormal findings: Secondary | ICD-10-CM

## 2013-06-11 DIAGNOSIS — N951 Menopausal and female climacteric states: Secondary | ICD-10-CM

## 2013-06-11 DIAGNOSIS — M7662 Achilles tendinitis, left leg: Secondary | ICD-10-CM | POA: Insufficient documentation

## 2013-06-11 MED ORDER — ZOLPIDEM TARTRATE 10 MG PO TABS
ORAL_TABLET | ORAL | Status: DC
Start: 2013-06-11 — End: 2014-01-04

## 2013-06-11 MED ORDER — CEPHALEXIN 500 MG PO CAPS: 500.0000 mg | ORAL_CAPSULE | Freq: Three times a day (TID) | ORAL | Status: DC

## 2013-06-11 NOTE — Assessment & Plan Note (Signed)
Down to once a week patch

## 2013-06-11 NOTE — Patient Instructions (Signed)
Will arrange referral about the neck pain radiation and deformity .   Ask about getting  assessment of the left ankle foot  Seems like achilles tendinitis or retrocalcaneal bursitis. Weight loss healthy way would be helpful to your cholesterol .  ROV in 6 months or so .

## 2013-06-11 NOTE — Progress Notes (Signed)
Chief Complaint  Patient presents with  . Annual Exam    HPI: Patient comes in today for Preventive Health Care visit  Update: family care stress   Mom falling  hosp fx and dad ailing  Prostate cancer dint want rx  . Different cities  . Graduated   [employer]  .    Neck Hump :still there and some pain down to right arm  No weakness     Arthritis   Wants to see ortho.  Referral  Dr Shon Baton  pain and deformity  Arm right. Marland Kitchen   HRT: Decrease patch to once a week.   Sleep still uses [place] husband says snores loudly but no osa sx at this time.  LEFT LEG milroys edema about the same no better with diuretics  Stocking and no skin break down needs prn antibiotic if has break in skin .  Left post achilles tendernes  No injury redness  Ongoing for 9 months worse getting upin am  No heel pain.  hasnt been able to lose weigh taking care of everyone else.   Health Maintenance  Topic Date Due  . Pap Smear  09/12/1973  . Influenza Vaccine  08/03/2013  . Mammogram  11/01/2014  . Colonoscopy  05/16/2016  . Tetanus/tdap  04/26/2020   Health Maintenance Review LIFESTYLE:  Exercise:  Not enough  Tobacco/ETS:no Alcohol: social  Sugar beverages: soda and tea and lemonade  Sleep: snores per family on [place]  Drug use: no Bone density: inpast ok Colonoscopy:  utd    ROS:  GEN/ HEENT: No fever, significant weight changes sweats headaches vision problems hearing changes, CV/ PULM; No chest pain shortness of breath cough, syncope,edema  change in exercise tolerance. GI /GU: No adominal pain, vomiting, change in bowel habits. No blood in the stool. No significant GU symptoms. SKIN/HEME: ,no acute skin rashes suspicious lesions or bleeding. No lymphadenopathy, nodules, masses.  NEURO/ PSYCH:  No neurologic signs such as weakness numbness. No depression anxiety.stress IMM/ Allergy: No unusual infections.  Allergy .   REST of 12 system review negative except as per HPI   Past Medical  History  Diagnosis Date  . GERD (gastroesophageal reflux disease)     stricture 2001  . Hx of colonic polyps   . Depression   . UTI (lower urinary tract infection)   . 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 Anxiety   . Arthritis   . History of pancreatitis   . History of esophageal stricture   . Allergy     SEASONAL  . Cataract   . Meningitis     Family History  Problem Relation Age of Onset  . Stroke Mother   . Diabetes Mother   . Hypertension Mother   . Heart disease Mother   . Osteoporosis Mother   . Arthritis Mother   . Hyperlipidemia Mother   . Colon cancer Paternal Grandmother     aunts and uncles  . Glaucoma Father   . Hypertension Father   . Gout Father   . Hyperlipidemia Father   . Pancreatic cancer Paternal Grandfather     lung, and stomach  . Diabetes Other     grandson  . Heart disease Maternal Grandmother   . Kidney disease Paternal Uncle     History   Social History  . Marital Status: Married    Spouse Name: N/A    Number of Children: 2  . Years  of Education: N/A   Occupational History  . retired    Social History Main Topics  . Smoking status: Never Smoker   . Smokeless tobacco: Never Used  . Alcohol Use: Yes     Comment: socially  . Drug Use: No  . Sexual Activity: None   Other Topics Concern  . None   Social History Narrative   Regular exercise- yes   Occupation: retired from [employer]   Married husband with parkinson's   2 children   Kayren Eaves back to school for advocacy [employer]       Finished degree in December . High GPA     mArch in [place]     Outpatient Encounter Prescriptions as of 06/11/2013  Medication Sig  . estradiol (VIVELLE-DOT) 0.1 MG/24HR patch Place 1 patch (0.1 mg total) onto the skin 2 (two) times a week.  . fluocinonide (LIDEX) 0.05 % cream Apply 1 application topically 2 (two) times daily as needed. For eczema  . zolpidem (AMBIEN) 10 MG tablet TAKE 1 TABLET BY MOUTH AT BEDTIME  .  [DISCONTINUED] zolpidem (AMBIEN) 10 MG tablet TAKE 1 TABLET BY MOUTH AT BEDTIME  . calcium-vitamin D (OSCAL WITH D) 500-200 MG-UNIT per tablet Take 1 tablet by mouth daily.    . cephALEXin (KEFLEX) 500 MG capsule Take 1 capsule (500 mg total) by mouth 3 (three) times daily. If needed for skin infection.  . [DISCONTINUED] cephALEXin (KEFLEX) 500 MG capsule Take 1 capsule (500 mg total) by mouth 3 (three) times daily. If needed for skin infection.    EXAM:  BP 114/70  Temp(Src) 98.9 F (37.2 C) (Oral)  Ht 5' 5.75" (1.67 m)  Wt 209 lb (94.802 kg)  BMI 33.99 kg/m2  Body mass index is 33.99 kg/(m^2). Wt Readings from Last 3 Encounters:  06/11/13 209 lb (94.802 kg)  12/11/12 211 lb (95.709 kg)  06/18/12 202 lb (91.627 kg)    Physical Exam: Vital signs reviewed ZOX:WRUE is a well-developed well-nourished alert cooperative    who appearsr stated age in no acute distress.  HEENT: normocephalic atraumatic , Eyes: PERRL EOM's full, conjunctiva clear, Nares: paten,t no deformity discharge or tenderness., Ears: no deformity EAC's clear TMs with normal landmarks. Mouth: clear OP, no lesions, edema.  Moist mucous membranes. Dentition in adequate repair. NECK: supple without masses, thyromegaly or bruits. Midline buff hump non tender   CHEST/PULM:  Clear to auscultation and percussion breath sounds equal no wheeze , rales or rhonchi. No chest wall deformities or tenderness. CV: PMI is nondisplaced, S1 S2 no gallops, murmurs, rubs. Peripheral pulses are full without delay.No JVD .  Breast: normal by inspection . No dimpling, discharge, masses, tenderness or discharge  ABDOMEN: Bowel sounds normal nontender  No guard or rebound, no hepato splenomegal no CVA tenderness.  No hernia. Extremtities:  No clubbing cyanosis    Lymphedema  Left  Tender and thickening achilles  Tendon and ? Swelling no redness or warmth  NEURO:  Oriented x3, cranial nerves 3-12 appear to be intact, no obvious focal weakness,gait  within normal limits no abnormal reflexes or asymmetrical SKIN: No acute rashes normal turgor, color, no bruising or petechiae. PSYCH: Oriented, good eye contact, no obvious depression anxiety, cognition and judgment appear normal. LN: no cervical axillary il adenopathy  Lab Results  Component Value Date   WBC 5.5 06/04/2013   HGB 13.3 06/04/2013   HCT 40.1 06/04/2013   PLT 261.0 06/04/2013   GLUCOSE 88 06/04/2013   CHOL 228* 06/04/2013  TRIG 63.0 06/04/2013   HDL 57.20 06/04/2013   LDLDIRECT 149.3 06/11/2012   LDLCALC 158* 06/04/2013   ALT 15 06/04/2013   AST 15 06/04/2013   NA 138 06/04/2013   K 4.0 06/04/2013   CL 103 06/04/2013   CREATININE 1.0 06/04/2013   BUN 10 06/04/2013   CO2 27 06/04/2013   TSH 1.10 06/04/2013    ASSESSMENT AND PLAN:  Discussed the following assessment and plan:  Routine general medical examination at a health care facility  MILROY'S DISEASE  Insomnia, unspecified - medication dependent  benefor more than risk at this time but if snoring monitory sleep breathing   Other and unspecified hyperlipidemia - intensify  lsi  Cervical disc disease - Plan: Ambulatory referral to Orthopedic Surgery  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 bursitis - ongooing pain in am seems localized on left lymphedematous leg.   Menopausal hot flushes  Medication management Counseled regarding healthy nutrition, exercise, sleep, injury prevention, calcium vit d and healthy weight .  Patient Care Team: Madelin HeadingsWanda K Arilyn Brierley, MD as PCP - General Meriel Picaichard M Holland, MD (Obstetrics and Gynecology) Louis Meckelobert D Kaplan, MD (Gastroenterology) Floyce StakesSusan E. Stinehelfer, MD (Dermatology) Patient Instructions  Will arrange referral about the neck pain radiation and deformity .   Ask about getting  assessment of the left ankle foot  Seems like achilles tendinitis or  retrocalcaneal bursitis. Weight loss healthy way would be helpful to your cholesterol .  ROV in 6 months or so .   Neta MendsWanda K. Tristine Langi M.D.  Pre visit review using our clinic review tool, if applicable. No additional management support is needed unless otherwise documented below in the visit note.

## 2013-07-10 ENCOUNTER — Telehealth: Payer: Self-pay | Admitting: Internal Medicine

## 2013-07-10 NOTE — Telephone Encounter (Signed)
Opened in error

## 2013-08-09 ENCOUNTER — Other Ambulatory Visit: Payer: Self-pay | Admitting: Internal Medicine

## 2013-08-09 NOTE — Telephone Encounter (Signed)
Sent to the pharmacy by e-scribe. 

## 2013-08-16 ENCOUNTER — Encounter: Payer: Self-pay | Admitting: Gastroenterology

## 2013-11-08 DIAGNOSIS — Z0279 Encounter for issue of other medical certificate: Secondary | ICD-10-CM

## 2014-01-04 ENCOUNTER — Other Ambulatory Visit: Payer: Self-pay | Admitting: Internal Medicine

## 2014-01-09 NOTE — Telephone Encounter (Signed)
Due for ROV  Ok to refill x 60 until gets in for appt . Med check

## 2014-01-27 ENCOUNTER — Encounter: Payer: Self-pay | Admitting: Internal Medicine

## 2014-01-27 ENCOUNTER — Encounter: Payer: Federal, State, Local not specified - PPO | Admitting: Internal Medicine

## 2014-01-27 NOTE — Progress Notes (Signed)
Document opened and reviewed for OV but appt  NS same day .   

## 2014-02-06 ENCOUNTER — Ambulatory Visit (INDEPENDENT_AMBULATORY_CARE_PROVIDER_SITE_OTHER): Payer: Federal, State, Local not specified - PPO | Admitting: Internal Medicine

## 2014-02-06 ENCOUNTER — Encounter: Payer: Self-pay | Admitting: Internal Medicine

## 2014-02-06 VITALS — BP 118/82 | Temp 97.9°F | Ht 65.75 in | Wt 206.0 lb

## 2014-02-06 DIAGNOSIS — Z9889 Other specified postprocedural states: Secondary | ICD-10-CM

## 2014-02-06 DIAGNOSIS — Q820 Hereditary lymphedema: Secondary | ICD-10-CM

## 2014-02-06 DIAGNOSIS — Z79899 Other long term (current) drug therapy: Secondary | ICD-10-CM

## 2014-02-06 DIAGNOSIS — G4700 Insomnia, unspecified: Secondary | ICD-10-CM

## 2014-02-06 NOTE — Patient Instructions (Addendum)
Continue as doing . benefit more than risk.  cpx and labs in 6 months .  Glad the neck is feeling better . Let us know if need to  Do pt referral for the edema.

## 2014-02-06 NOTE — Progress Notes (Signed)
Pre visit review using our clinic review tool, if applicable. No additional management support is needed unless otherwise documented below in the visit note.  Chief Complaint  Patient presents with  . Follow-up    HPI: Lindsay Parks 59 y.o.   Medication evaluation for  Sleep . Remus Loffler works very well 10 mg every 90 days except one dud in there.  Estrogen one per week weaning doing ok  Legs  :  Should be seeing .a therapist but no one local for wrapping  Integrative therapies .  considerting new techniques .   Neck   Stiff for 2 years  djd   Had surgery dr Shon Baton in novemeber  Feels much better  Pain much beter ROS: See pertinent positives and negatives per HPI.  Past Medical History  Diagnosis Date  . GERD (gastroesophageal reflux disease)     stricture 2001  . Hx of colonic polyps   . Depression   . UTI (lower urinary tract infection)   . 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 Anxiety   . Arthritis   . History of pancreatitis   . History of esophageal stricture   . Allergy     SEASONAL  . Cataract   . Meningitis     Family History  Problem Relation Age of Onset  . Stroke Mother   . Diabetes Mother   . Hypertension Mother   . Heart disease Mother   . Osteoporosis Mother   . Arthritis Mother   . Hyperlipidemia Mother   . Colon cancer Paternal Grandmother     aunts and uncles  . Glaucoma Father   . Hypertension Father   . Gout Father   . Hyperlipidemia Father   . Pancreatic cancer Paternal Grandfather     lung, and stomach  . Diabetes Other     grandson  . Heart disease Maternal Grandmother   . Kidney disease Paternal Uncle     History   Social History  . Marital Status: Married    Spouse Name: N/A    Number of Children: 2  . Years of Education: N/A   Occupational History  . retired    Social History Main Topics  . Smoking status: Never Smoker   . Smokeless tobacco: Never Used  . Alcohol Use: Yes     Comment: socially  .  Drug Use: No  . Sexual Activity: None   Other Topics Concern  . None   Social History Narrative   Regular exercise- yes   Occupation: retired from [employer]   Married husband with parkinson's   2 children   Kayren Eaves back to school for advocacy [employer]       Finished degree in December . High GPA     mArch in [place]     Outpatient Encounter Prescriptions as of 02/06/2014  Medication Sig  . estradiol (VIVELLE-DOT) 0.1 MG/24HR patch Place 1 patch (0.1 mg total) onto the skin 2 (two) times a week.  . fluocinonide (LIDEX) 0.05 % cream Apply 1 application topically 2 (two) times daily as needed. For eczema  . VIVELLE-DOT 0.1 MG/24HR patch APPLY 1 PATCH TO SKIN TWICE A WEEK  . zolpidem (AMBIEN) 10 MG tablet TAKE 1 TABLET BY MOUTH AT BEDTIME  . calcium-vitamin D (OSCAL WITH D) 500-200 MG-UNIT per tablet Take 1 tablet by mouth daily.    . [DISCONTINUED] cephALEXin (KEFLEX) 500 MG capsule Take 1 capsule (500 mg total) by  mouth 3 (three) times daily. If needed for skin infection.    EXAM:  BP 118/82 mmHg  Temp(Src) 97.9 F (36.6 C) (Oral)  Ht 5' 5.75" (1.67 m)  Wt 206 lb (93.441 kg)  BMI 33.50 kg/m2  Body mass index is 33.5 kg/(m^2).  GENERAL: vitals reviewed and listed above, alert, oriented, appears well hydrated and in no acute distress HEENT: atraumatic, conjunctiva  clear, no obvious abnormalities on inspection of external nose and ears  NECK: no obvious masses on inspection in soft collar inc well healed   lle larger than right not examined PSYCH: pleasant and cooperative, no obvious depression or anxiety   Lab Results  Component Value Date   WBC 5.5 06/04/2013   HGB 13.3 06/04/2013   HCT 40.1 06/04/2013   PLT 261.0 06/04/2013   GLUCOSE 88 06/04/2013   CHOL 228* 06/04/2013   TRIG 63.0 06/04/2013   HDL 57.20 06/04/2013   LDLDIRECT 149.3 06/11/2012   LDLCALC 158* 06/04/2013   ALT 15 06/04/2013   AST 15 06/04/2013   NA 138 06/04/2013   K 4.0 06/04/2013   CL 103  06/04/2013   CREATININE 1.0 06/04/2013   BUN 10 06/04/2013   CO2 27 06/04/2013   TSH 1.10 06/04/2013    ASSESSMENT AND PLAN:  Discussed the following assessment and plan:  Insomnia - benefot more than risk  contniue  Medication management  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  -Patient advised to return or notify health care team  if symptoms worsen ,persist or new concerns arise. i ninterim  Patient Instructions   Continue as doing . benefit more than risk.  cpx and labs in 6 months .  Glad the neck is feeling better . Let us know if need to  Do pt referral for the edema.  Neta MendsWanda K. Montez Cuda M.D.

## 2014-03-24 ENCOUNTER — Other Ambulatory Visit: Payer: Self-pay | Admitting: Internal Medicine

## 2014-03-25 NOTE — Telephone Encounter (Signed)
Ok to refill x 6 months 

## 2014-03-25 NOTE — Telephone Encounter (Signed)
Called to the pharmacy and left on machine. 

## 2014-04-01 ENCOUNTER — Other Ambulatory Visit: Payer: Self-pay | Admitting: Internal Medicine

## 2014-04-02 NOTE — Telephone Encounter (Signed)
Sent to the pharmacy.  According to last note, pt now using one patch weekly.

## 2014-04-29 ENCOUNTER — Other Ambulatory Visit: Payer: Self-pay | Admitting: Internal Medicine

## 2014-06-21 ENCOUNTER — Other Ambulatory Visit: Payer: Self-pay | Admitting: Internal Medicine

## 2014-06-23 ENCOUNTER — Other Ambulatory Visit: Payer: Self-pay | Admitting: Family Medicine

## 2014-06-23 ENCOUNTER — Telehealth: Payer: Self-pay | Admitting: Family Medicine

## 2014-06-23 DIAGNOSIS — Z0000 Encounter for general adult medical examination without abnormal findings: Secondary | ICD-10-CM

## 2014-06-23 NOTE — Telephone Encounter (Signed)
Sent to the pharmacy by e-scribe.  Pt due for CPX in August.  Will send a message to scheduling.

## 2014-06-23 NOTE — Telephone Encounter (Signed)
Pt is due for lab work and cpx in Aug.  Please help the pt to make both appointments.  I have entered her lab orders.  Thanks!

## 2014-06-26 NOTE — Telephone Encounter (Signed)
Lm with pt hus for pt to callback

## 2014-06-26 NOTE — Telephone Encounter (Signed)
Patient is scheduled   

## 2014-08-19 ENCOUNTER — Other Ambulatory Visit (INDEPENDENT_AMBULATORY_CARE_PROVIDER_SITE_OTHER): Payer: Federal, State, Local not specified - PPO

## 2014-08-19 ENCOUNTER — Encounter: Payer: Self-pay | Admitting: *Deleted

## 2014-08-19 DIAGNOSIS — Z0000 Encounter for general adult medical examination without abnormal findings: Secondary | ICD-10-CM

## 2014-08-19 LAB — CBC WITH DIFFERENTIAL/PLATELET
BASOS ABS: 0 10*3/uL (ref 0.0–0.1)
Basophils Relative: 0.5 % (ref 0.0–3.0)
EOS ABS: 0.1 10*3/uL (ref 0.0–0.7)
Eosinophils Relative: 1.9 % (ref 0.0–5.0)
HEMATOCRIT: 40.2 % (ref 36.0–46.0)
Hemoglobin: 13.3 g/dL (ref 12.0–15.0)
LYMPHS PCT: 31.3 % (ref 12.0–46.0)
Lymphs Abs: 1.7 10*3/uL (ref 0.7–4.0)
MCHC: 33.2 g/dL (ref 30.0–36.0)
MCV: 87.4 fl (ref 78.0–100.0)
Monocytes Absolute: 0.3 10*3/uL (ref 0.1–1.0)
Monocytes Relative: 6 % (ref 3.0–12.0)
NEUTROS ABS: 3.2 10*3/uL (ref 1.4–7.7)
Neutrophils Relative %: 60.3 % (ref 43.0–77.0)
PLATELETS: 257 10*3/uL (ref 150.0–400.0)
RBC: 4.59 Mil/uL (ref 3.87–5.11)
RDW: 13.6 % (ref 11.5–15.5)
WBC: 5.3 10*3/uL (ref 4.0–10.5)

## 2014-08-19 LAB — BASIC METABOLIC PANEL
BUN: 13 mg/dL (ref 6–23)
CALCIUM: 9.4 mg/dL (ref 8.4–10.5)
CO2: 30 mEq/L (ref 19–32)
CREATININE: 0.98 mg/dL (ref 0.40–1.20)
Chloride: 107 mEq/L (ref 96–112)
GFR: 74.72 mL/min (ref >60.00)
Glucose, Bld: 94 mg/dL (ref 70–99)
Potassium: 4.5 mEq/L (ref 3.5–5.1)
Sodium: 141 mEq/L (ref 135–145)

## 2014-08-19 LAB — HEPATIC FUNCTION PANEL
ALT: 10 U/L (ref 0–35)
AST: 15 U/L (ref 0–37)
Albumin: 3.9 g/dL (ref 3.5–5.2)
Alkaline Phosphatase: 69 U/L (ref 39–117)
BILIRUBIN DIRECT: 0 mg/dL (ref 0.0–0.3)
TOTAL PROTEIN: 6.5 g/dL (ref 6.0–8.3)
Total Bilirubin: 0.4 mg/dL (ref 0.2–1.2)

## 2014-08-19 LAB — LIPID PANEL
CHOLESTEROL: 199 mg/dL (ref 0–200)
HDL: 52.5 mg/dL (ref >39.00)
LDL CALC: 138 mg/dL — AB (ref 0–99)
NonHDL: 146.79
TRIGLYCERIDES: 46 mg/dL (ref 0.0–149.0)
Total CHOL/HDL Ratio: 4
VLDL: 9.2 mg/dL (ref 0.0–40.0)

## 2014-08-19 LAB — TSH: TSH: 1.58 u[IU]/mL (ref 0.35–4.50)

## 2014-08-26 ENCOUNTER — Ambulatory Visit (INDEPENDENT_AMBULATORY_CARE_PROVIDER_SITE_OTHER): Payer: Federal, State, Local not specified - PPO | Admitting: Internal Medicine

## 2014-08-26 ENCOUNTER — Encounter: Payer: Self-pay | Admitting: Internal Medicine

## 2014-08-26 VITALS — BP 120/80 | Temp 98.7°F | Ht 66.0 in | Wt 200.6 lb

## 2014-08-26 DIAGNOSIS — Q820 Hereditary lymphedema: Secondary | ICD-10-CM

## 2014-08-26 DIAGNOSIS — Z0000 Encounter for general adult medical examination without abnormal findings: Secondary | ICD-10-CM | POA: Diagnosis not present

## 2014-08-26 DIAGNOSIS — N951 Menopausal and female climacteric states: Secondary | ICD-10-CM

## 2014-08-26 DIAGNOSIS — Z79890 Hormone replacement therapy (postmenopausal): Secondary | ICD-10-CM

## 2014-08-26 DIAGNOSIS — G4700 Insomnia, unspecified: Secondary | ICD-10-CM

## 2014-08-26 DIAGNOSIS — Z23 Encounter for immunization: Secondary | ICD-10-CM

## 2014-08-26 DIAGNOSIS — Z79899 Other long term (current) drug therapy: Secondary | ICD-10-CM | POA: Diagnosis not present

## 2014-08-26 MED ORDER — ZOLPIDEM TARTRATE 10 MG PO TABS: 10.0000 mg | ORAL_TABLET | Freq: Every day | ORAL | Status: DC

## 2014-08-26 NOTE — Patient Instructions (Signed)
Continue lifestyle intervention healthy eating and exercise .  Healthy lifestyle includes : At least 150 minutes of exercise weeks  , weight at healthy levels, which is usually   BMI 19-25. Avoid trans fats and processed foods;  Increase fresh fruits and veges to 5 servings per day. And avoid sweet beverages including tea and juice. Mediterranean diet with olive oil and nuts have been noted to be heart and brain healthy . Avoid tobacco products . Limit  alcohol to  7 per week for women and 14 servings for men.  Get adequate sleep . Wear seat belts . Don't text and drive .   Med check in 6 months  .

## 2014-08-26 NOTE — Progress Notes (Signed)
Chief Complaint  Patient presents with  . Annual Exam    HPI: Patient  Lindsay Parks  59 y.o. 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  nightly without sig se   Health Maintenance  Topic Date Due  . Hepatitis C Screening  03/20/1955  . HIV Screening  09/13/1970  . INFLUENZA VACCINE  08/04/2014  . MAMMOGRAM  11/01/2014  . COLONOSCOPY  05/16/2016  . 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  Tobacco/ETS:  No  Alcohol:   No  Sugar beverages: creamer .  Sleep: with Remus Loffler helps  Drug use: no  ROS:  milroys disease  Stable  GEN/ HEENT: No fever, significant weight changes sweats headaches vision problems hearing changes, CV/ PULM; No chest pain shortness of breath cough, syncope,edema  change in exercise tolerance. GI /GU: No adominal pain, vomiting, change in bowel habits. No blood in the stool. No significant GU symptoms. SKIN/HEME: ,no acute skin rashes suspicious lesions or bleeding. No lymphadenopathy, nodules, masses.  NEURO/ PSYCH:  No neurologic signs such as weakness numbness. No depression anxiety. IMM/ Allergy: No unusual infections.  Allergy .   REST of 12 system review negative except as per HPI   Past Medical History  Diagnosis Date  . GERD (gastroesophageal reflux disease)     stricture 2001  . Hx of colonic polyps   . Depression   . UTI (lower urinary tract infection)   . 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 Anxiety   . Arthritis   . History of pancreatitis   . History of esophageal stricture   . Allergy     SEASONAL  . Cataract   . Meningitis     Past Surgical History  Procedure Laterality Date  . Esophagogastroduodenoscopy     . Flexible sigmoidoscopy    . Partial hysterectomy  2005    left hydrosalpinx ovarian cyst  . Colonoscopy    . Total abdominal hysterectomy    . Cholecystectomy    . Shoulder surgery    . Elbow surgery      Family History  Problem Relation Age of Onset  . Stroke Mother   . Diabetes Mother   . Hypertension Mother   . Heart disease Mother   . Osteoporosis Mother   . Arthritis Mother   . Hyperlipidemia Mother   . Colon cancer Paternal Grandmother     aunts and uncles  . Glaucoma Father   . Hypertension Father   . Gout Father   . Hyperlipidemia Father   . Pancreatic cancer Paternal Grandfather     lung, and stomach  . Diabetes Other     grandson  . Heart disease Maternal Grandmother   . Kidney disease Paternal Uncle     Social History   Social History  . Marital Status: Married    Spouse Name: N/A  . Number of Children: 2  . Years of Education: N/A   Occupational History  . retired    Social History Main Topics  . Smoking status: Never Smoker   . Smokeless tobacco: Never Used  .  Alcohol Use: Yes     Comment: socially  . Drug Use: No  . Sexual Activity: Not Asked   Other Topics Concern  . None   Social History Narrative   Regular exercise- yes   Occupation: retired from [employer]   Married husband with parkinson's   2 children   Kayren Eaves back to school for advocacy [employer]       Finished degree in December . High GPA     mArch in [place]     Outpatient Prescriptions Prior to Visit  Medication Sig Dispense Refill  . calcium-vitamin D (OSCAL WITH D) 500-200 MG-UNIT per tablet Take 1 tablet by mouth daily.      Marland Kitchen estradiol (VIVELLE-DOT) 0.1 MG/24HR patch Place 1 patch (0.1 mg total) onto the skin 2 (two) times a week. 8 patch 0  . estradiol (VIVELLE-DOT) 0.1 MG/24HR patch APPLY 1 PATCH TO SKIN TWICE A WEEK 8 patch 0  . fluocinonide (LIDEX) 0.05 % cream Apply 1 application topically 2 (two) times daily as needed. For eczema    . zolpidem (AMBIEN) 10 MG  tablet TAKE 1 TABLET BY MOUTH AT BEDTIME 60 tablet 2   No facility-administered medications prior to visit.     EXAM:  BP 120/80 mmHg  Temp(Src) 98.7 F (37.1 C) (Oral)  Ht  (1.676 m)  Wt 200 lb 9.6 oz (90.992 kg)  BMI 32.39 kg/m2  Body mass index is 32.39 kg/(m^2).  Physical Exam: Vital signs reviewed JXB:JYNW is a well-developed well-nourished alert cooperative    who appearsr stated age in no acute distress.  HEENT: normocephalic atraumatic , Eyes: PERRL EOM's full, conjunctiva clear, Nares: paten,t no deformity discharge or tenderness., Ears: no deformity EAC's clear TMs with normal landmarks. Mouth: clear OP, no lesions, edema.  Moist mucous membranes. Dentition in adequate repair. NECK: supple without masses, thyromegaly or bruits. Well healed neck scar  CHEST/PULM:  Clear to auscultation and percussion breath sounds equal no wheeze , rales or rhonchi. No chest wall deformities or tenderness.Breast: normal by inspection . No dimpling, discharge, masses, tenderness or discharge . CV: PMI is nondisplaced, S1 S2 no gallops, murmurs, rubs. Peripheral pulses are full without delay.No JVD .  ABDOMEN: Bowel sounds normal nontender  No guard or rebound, no hepato splenomegal no CVA tenderness.  No hernia. Extremtities:  No clubbing cyanosis left leg in wrap toes warm  rith mild edema  No ulcers  NEURO:  Oriented x3, cranial nerves 3-12 appear to be intact, no obvious focal weakness,gait within normal limits SKIN: No acute rashes normal turgor, color, no bruising or petechiae. PSYCH: Oriented, good eye contact, no obvious depression anxiety, cognition and judgment appear normal. LN: no cervical axillary inguinal adenopathy  Lab Results  Component Value Date   WBC 5.3 08/19/2014   HGB 13.3 08/19/2014   HCT 40.2 08/19/2014   PLT 257.0 08/19/2014   GLUCOSE 94 08/19/2014   CHOL 199 08/19/2014   TRIG 46.0 08/19/2014   HDL 52.50 08/19/2014   LDLDIRECT 149.3 06/11/2012   LDLCALC  138* 08/19/2014   ALT 10 08/19/2014   AST 15 08/19/2014   NA 141 08/19/2014   K 4.5 08/19/2014   CL 107 08/19/2014   CREATININE 0.98 08/19/2014   BUN 13 08/19/2014   CO2 30 08/19/2014   TSH 1.58 08/19/2014   Wt Readings from Last 3 Encounters:  08/26/14 200 lb 9.6 oz (90.992 kg)  02/06/14 206 lb (93.441 kg)  06/11/13 209 lb (94.802 kg)  BP Readings from Last 3 Encounters:  08/26/14 120/80  02/06/14 118/82  06/11/13 114/70     ASSESSMENT AND PLAN:  Discussed the following assessment and plan:  Visit for preventive health examination  Insomnia - benefit more than risk reviwed at this time refill   MILROY'S DISEASE - stable agree with podaitry involved   Medication management  Menopausal hot flushes  Postmenopausal HRT (hormone replacement therapy) - down to one patch per week and tolerating   Encounter for immunization weight loss   lsi helping lipids and weight   Continue  Advice    Patient Care Team: [name], MD as PCP - General Richarda Overlie, MD (Obstetrics and Gynecology) Louis Meckel, MD (Gastroenterology) Bufford Buttner, MD (Dermatology) Patient Instructions  Continue lifestyle intervention healthy eating and exercise .  Healthy lifestyle includes : At least 150 minutes of exercise weeks  , weight at healthy levels, which is usually   BMI 19-25. Avoid trans fats and processed foods;  Increase fresh fruits and veges to 5 servings per day. And avoid sweet beverages including tea and juice. Mediterranean diet with olive oil and nuts have been noted to be heart and brain healthy . Avoid tobacco products . Limit  alcohol to  7 per week for women and 14 servings for men.  Get adequate sleep . Wear seat belts . Don't text and drive .   Med check in 6 months  .     Neta Mends. Panosh M.D.

## 2014-09-02 ENCOUNTER — Encounter: Payer: Self-pay | Admitting: Internal Medicine

## 2014-12-11 ENCOUNTER — Other Ambulatory Visit: Payer: Self-pay | Admitting: Internal Medicine

## 2014-12-15 NOTE — Telephone Encounter (Signed)
Sent to the pharmacy by e-scribe.  Pt has upcoming appt on 02/23/15 for follow up.

## 2015-02-23 ENCOUNTER — Ambulatory Visit (INDEPENDENT_AMBULATORY_CARE_PROVIDER_SITE_OTHER): Payer: Federal, State, Local not specified - PPO | Admitting: Internal Medicine

## 2015-02-23 ENCOUNTER — Encounter: Payer: Self-pay | Admitting: Internal Medicine

## 2015-02-23 VITALS — BP 130/80 | Temp 98.6°F | Wt 193.9 lb

## 2015-02-23 DIAGNOSIS — Z6379 Other stressful life events affecting family and household: Secondary | ICD-10-CM

## 2015-02-23 DIAGNOSIS — Q820 Hereditary lymphedema: Secondary | ICD-10-CM | POA: Diagnosis not present

## 2015-02-23 DIAGNOSIS — Z79899 Other long term (current) drug therapy: Secondary | ICD-10-CM | POA: Diagnosis not present

## 2015-02-23 DIAGNOSIS — G4700 Insomnia, unspecified: Secondary | ICD-10-CM

## 2015-02-23 DIAGNOSIS — Z79890 Hormone replacement therapy (postmenopausal): Secondary | ICD-10-CM | POA: Diagnosis not present

## 2015-02-23 MED ORDER — ZOLPIDEM TARTRATE 10 MG PO TABS: 10.0000 mg | ORAL_TABLET | Freq: Every day | ORAL | Status: DC

## 2015-02-23 NOTE — Progress Notes (Signed)
Pre visit review using our clinic review tool, if applicable. No additional management support is needed unless otherwise documented below in the visit note.  Chief Complaint  Patient presents with  . Follow-up    meds    HPI: Lindsay Parks 60 y.o. comes in for  Fu meds eval   Taking ambien at night and helps still  Mom 31   Now moved in after falls and fractures   She is  care taker  Hemiparesis. jsut started walking again  Husband  Was in hospital also . Knee surgery and then sepsis uri  Still dec mobility hrt 1 per week. 7-8 hours sleep   ambien  .   Achilles bothering her on worse leg     No  Local special sit with [facility]     But  Poss dr Victorino Dike could help   ROS: See pertinent positives and negatives per HPI. No cp sob  Other change in health   Past Medical History  Diagnosis Date  . GERD (gastroesophageal reflux disease)     stricture 2001  . Hx of colonic polyps   . Depression   . UTI (lower urinary tract infection)   . 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 Anxiety   . Arthritis   . History of pancreatitis   . History of esophageal stricture   . Allergy     SEASONAL  . Cataract   . Meningitis     Family History  Problem Relation Age of Onset  . Stroke Mother   . Diabetes Mother   . Hypertension Mother   . Heart disease Mother   . Osteoporosis Mother   . Arthritis Mother   . Hyperlipidemia Mother   . Colon cancer Paternal Grandmother     aunts and uncles  . Glaucoma Father   . Hypertension Father   . Gout Father   . Hyperlipidemia Father   . Pancreatic cancer Paternal Grandfather     lung, and stomach  . Diabetes Other     grandson  . Heart disease Maternal Grandmother   . Kidney disease Paternal Uncle     Social History   Social History  . Marital Status: Married    Spouse Name: N/A  . Number of Children: 2  . Years of Education: N/A   Occupational History  . retired    Social History Main Topics  . Smoking status:  Never Smoker   . Smokeless tobacco: Never Used  . Alcohol Use: Yes     Comment: socially  . Drug Use: No  . Sexual Activity: Not Asked   Other Topics Concern  . None   Social History Narrative   Regular exercise- yes   Occupation: retired from [employer]   Married husband with parkinson's   2 children   Kayren Eaves back to school for advocacy [employer]       Finished degree in December . High GPA     mArch in [place]     Outpatient Prescriptions Prior to Visit  Medication Sig Dispense Refill  . calcium-vitamin D (OSCAL WITH D) 500-200 MG-UNIT per tablet Take 1 tablet by mouth daily.      Marland Kitchen estradiol (VIVELLE-DOT) 0.1 MG/24HR patch APPLY 1 PATCH TO SKIN TWICE A WEEK 8 patch 2  . fluocinonide (LIDEX) 0.05 % cream Apply 1 application topically 2 (two) times daily as needed. For eczema    . zolpidem (AMBIEN) 10  MG tablet Take 1 tablet (10 mg total) by mouth at bedtime. 60 tablet 3  . estradiol (VIVELLE-DOT) 0.1 MG/24HR patch Place 1 patch (0.1 mg total) onto the skin 2 (two) times a week. 8 patch 0   No facility-administered medications prior to visit.     EXAM:  BP 130/80 mmHg  Temp(Src) 98.6 F (37 C) (Oral)  Wt 193 lb 14.4 oz (87.952 kg)  Body mass index is 31.31 kg/(m^2).  GENERAL: vitals reviewed and listed above, alert, oriented, appears well hydrated and in no acute distress HEENT: atraumatic, conjunctiva  clear, no obvious abnormalities on inspection of external nose and ears  Leg is wrapped  MS: moves all extremities  Ambulatory  PSYCH: pleasant and cooperative, no obvious depression or anxiety Lab Results  Component Value Date   WBC 5.3 08/19/2014   HGB 13.3 08/19/2014   HCT 40.2 08/19/2014   PLT 257.0 08/19/2014   GLUCOSE 94 08/19/2014   CHOL 199 08/19/2014   TRIG 46.0 08/19/2014   HDL 52.50 08/19/2014   LDLDIRECT 149.3 06/11/2012   LDLCALC 138* 08/19/2014   ALT 10 08/19/2014   AST 15 08/19/2014   NA 141 08/19/2014   K 4.5 08/19/2014   CL 107 08/19/2014    CREATININE 0.98 08/19/2014   BUN 13 08/19/2014   CO2 30 08/19/2014   TSH 1.58 08/19/2014   Wt Readings from Last 3 Encounters:  02/23/15 193 lb 14.4 oz (87.952 kg)  08/26/14 200 lb 9.6 oz (90.992 kg)  02/06/14 206 lb (93.441 kg)   BP Readings from Last 3 Encounters:  02/23/15 130/80  08/26/14 120/80  02/06/14 118/82    ASSESSMENT AND PLAN:  Discussed the following assessment and plan:  Insomnia - benefit more than risk   for now continue.   MILROY'S DISEASE - becomes problematic and has achilles sx   see dr Victorino Dike ask if we need  to refer.   Medication management  Postmenopausal HRT (hormone replacement therapy) - down to 1 patch per week   Stress due to illness of family member - mom and husband recoveraing at home she is promary caretaker Total visit > 50% spent counseling and coordinating care as indicated in above note and in instructions to patient .  -Patient advised to return or notify health care team  if symptoms worsen ,persist or new concerns arise. consdier cbt  meds like belsomra etc if needed in future .   Patient Instructions  Benefit more than risk of medications   At this time    to continue. Plan 6 months wellness check .  Agree with Dr.  Earlie Counts. Evaluation.   Let us know if need  referral .      Neta Mends. Panosh M.D.

## 2015-02-23 NOTE — Patient Instructions (Signed)
Benefit more than risk of medications   At this time    to continue. Plan 6 months wellness check .  Agree with Dr.  Earlie Counts. Evaluation.   Let us know if need  referral .

## 2015-03-20 ENCOUNTER — Other Ambulatory Visit: Payer: Self-pay | Admitting: Internal Medicine

## 2015-03-23 NOTE — Telephone Encounter (Signed)
Sent to the pharmacy by e-scribe. 

## 2015-05-17 ENCOUNTER — Emergency Department (HOSPITAL_COMMUNITY): Payer: Federal, State, Local not specified - PPO

## 2015-05-17 ENCOUNTER — Encounter (HOSPITAL_COMMUNITY): Payer: Self-pay

## 2015-05-17 ENCOUNTER — Emergency Department (HOSPITAL_COMMUNITY)
Admission: EM | Admit: 2015-05-17 | Discharge: 2015-05-17 | Disposition: A | Discharge status: Home / Self Care | Payer: Federal, State, Local not specified - PPO | Attending: Emergency Medicine | Admitting: Emergency Medicine

## 2015-05-17 DIAGNOSIS — Z79899 Other long term (current) drug therapy: Secondary | ICD-10-CM | POA: Diagnosis not present

## 2015-05-17 DIAGNOSIS — I890 Lymphedema, not elsewhere classified: Secondary | ICD-10-CM | POA: Insufficient documentation

## 2015-05-17 DIAGNOSIS — K219 Gastro-esophageal reflux disease without esophagitis: Secondary | ICD-10-CM | POA: Insufficient documentation

## 2015-05-17 DIAGNOSIS — F329 Major depressive disorder, single episode, unspecified: Secondary | ICD-10-CM | POA: Diagnosis not present

## 2015-05-17 DIAGNOSIS — R079 Chest pain, unspecified: Secondary | ICD-10-CM | POA: Insufficient documentation

## 2015-05-17 DIAGNOSIS — M1990 Unspecified osteoarthritis, unspecified site: Secondary | ICD-10-CM | POA: Insufficient documentation

## 2015-05-17 LAB — BASIC METABOLIC PANEL
ANION GAP: 9 (ref 5–15)
BUN: 8 mg/dL (ref 6–20)
CO2: 27 mmol/L (ref 22–32)
Calcium: 9.6 mg/dL (ref 8.9–10.3)
Chloride: 103 mmol/L (ref 101–111)
Creatinine, Ser: 0.98 mg/dL (ref 0.44–1.00)
Glucose, Bld: 90 mg/dL (ref 65–99)
POTASSIUM: 3.7 mmol/L (ref 3.5–5.1)
SODIUM: 139 mmol/L (ref 135–145)

## 2015-05-17 LAB — I-STAT TROPONIN, ED: Troponin i, poc: 0 ng/mL (ref 0.00–0.08)

## 2015-05-17 LAB — CBC
HEMATOCRIT: 41.5 % (ref 36.0–46.0)
Hemoglobin: 13.9 g/dL (ref 12.0–15.0)
MCH: 28.8 pg (ref 26.0–34.0)
MCHC: 33.5 g/dL (ref 30.0–36.0)
MCV: 86.1 fL (ref 78.0–100.0)
PLATELETS: 247 10*3/uL (ref 150–400)
RBC: 4.82 MIL/uL (ref 3.87–5.11)
RDW: 13.4 % (ref 11.5–15.5)
WBC: 5.6 10*3/uL (ref 4.0–10.5)

## 2015-05-17 NOTE — ED Notes (Signed)
Patient states she has been having constant chest pain x 3 months (mid and left chest) with nausea, lightheadedness.

## 2015-05-17 NOTE — ED Provider Notes (Signed)
CSN: [id number]     Arrival date & time 05/17/15  1016 History   First MD Initiated Contact with Patient 05/17/15 1109     Chief Complaint  Patient presents with  . Chest Pain     (Consider location/radiation/quality/duration/timing/severity/associated sxs/prior Treatment) Patient is a 60 y.o. female presenting with chest pain. The history is provided by the patient. No language interpreter was used.  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  Pt reports continous pain for 3 months  Past Medical History  Diagnosis Date  . GERD (gastroesophageal reflux disease)     stricture 2001  . Hx of colonic polyps   . Depression   . UTI (lower urinary tract infection)   . 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 Anxiety   . Arthritis   . History of pancreatitis   . History of esophageal stricture   . Allergy     SEASONAL  . Cataract   . Meningitis    Past Surgical History  Procedure Laterality Date  . Esophagogastroduodenoscopy    . Flexible sigmoidoscopy    . Partial hysterectomy  2005    left hydrosalpinx ovarian cyst  . Colonoscopy    . Total abdominal hysterectomy    . Cholecystectomy    . Shoulder surgery    . Elbow surgery    . Cervical discectomy      c5 and c6   Family History  Problem Relation Age of Onset  . Stroke Mother   . Diabetes Mother   . Hypertension Mother   . Heart disease Mother   . Osteoporosis Mother   . Arthritis Mother   . Hyperlipidemia Mother   . Colon cancer Paternal Grandmother     aunts  and uncles  . Glaucoma Father   . Hypertension Father   . Gout Father   . Hyperlipidemia Father   . Pancreatic cancer Paternal Grandfather     lung, and stomach  . Diabetes Other     grandson  . Heart disease Maternal Grandmother   . Kidney disease Paternal Uncle    Social History  Substance Use Topics  . Smoking status: Never Smoker   . Smokeless tobacco: Never Used  . Alcohol Use: Yes     Comment: socially   OB History    No data available     Review of Systems  Constitutional: Negative for fever.  Cardiovascular: Positive for chest pain.  All other systems reviewed and are negative.     Allergies  Review of patient's allergies indicates no known allergies.  Home Medications   Prior to Admission medications   Medication Sig Start Date End Date Taking? Authorizing Provider  acetaminophen (TYLENOL) 500 MG tablet Take 500 mg by mouth every 6 (six) hours as needed for mild pain or moderate pain.   Yes Historical Provider, MD  calcium-vitamin D (OSCAL WITH D) 500-200 MG-UNIT per tablet Take 1 tablet by mouth daily.     Yes Historical Provider, MD  diphenhydrAMINE (BENADRYL) 25 MG tablet Take 25 mg by mouth every 8 (eight) hours as needed for allergies.   Yes Historical Provider, MD  estradiol (VIVELLE-DOT) 0.1 MG/24HR patch APPLY 1 PATCH TO SKIN TWICE A WEEK Patient taking differently: APPLY 1 PATCH TO SKIN ONCE A WEEK 03/23/15  Yes [name], MD  fluocinonide (LIDEX) 0.05 % cream Apply 1 application topically 2 (two) times daily as needed. For eczema   Yes Historical Provider, MD  zolpidem (AMBIEN) 10 MG tablet Take 1 tablet (10 mg total) by mouth at bedtime. 02/23/15  Yes [name], MD   BP 126/81 mmHg  Pulse 67  Temp(Src) 97.7 F (36.5 C) (Oral)  Resp 17  SpO2 100% Physical Exam  Constitutional: She appears well-developed and well-nourished.  HENT:  Head: Normocephalic and atraumatic.  Eyes: Conjunctivae and EOM are normal. Pupils are equal, round, and  reactive to light.  Neck: Normal range of motion.  Cardiovascular: Normal rate and normal heart sounds.   Pulmonary/Chest: Effort normal and breath sounds normal.  Abdominal: Soft. Bowel sounds are normal.  Musculoskeletal: Normal range of motion.  Skin: Skin is warm.  Psychiatric: She has a normal mood and affect.  Nursing note and vitals reviewed.   ED Course  Procedures (including critical care time) Labs Review Labs Reviewed  BASIC METABOLIC PANEL  CBC  I-STAT TROPOININ, ED    Imaging Review Dg Chest 2 View  05/17/2015  CLINICAL DATA:  Chest pressure. EXAM: CHEST  2 VIEW COMPARISON:  None. FINDINGS: Lungs are adequately inflated and otherwise clear. Cardiomediastinal silhouette is within normal. Fusion hardware is intact over the lower cervical spine. IMPRESSION: No active cardiopulmonary disease. Electronically Signed   By: Elberta Fortis M.D.   On: 05/17/2015 11:36   I have personally reviewed and evaluated these images and lab results as part of my medical decision-making.   EKG Interpretation   Date/Time:  Sunday May 17 2015 10:35:49 EDT Ventricular Rate:  63 PR Interval:  127 QRS Duration: 85 QT Interval:  405 QTC Calculation: 415 R Axis:   69 Text Interpretation:  Sinus rhythm No old tracing to compare Confirmed by  [facility]  MD, ELLIOTT ([id number]) on 05/17/2015 11:42:20 AM      MDM Pt reports she has constant chest pain.  Pain does not change.  I doubt cardiac or pulmonary etiology.  Pt advised to follow up with Dr. Fabian Sharp for evaluation.     Final diagnoses:  Chest pain, unspecified chest pain type    An After Visit Summary was printed and given to the patient.   Lonia Skinner [place], PA-C 05/17/15 1208  Mancel Bale, MD 05/18/15 [phone]

## 2015-05-17 NOTE — Discharge Instructions (Signed)
Nonspecific Chest Pain  °Chest pain can be caused by many different conditions. There is always a chance that your pain could be related to something serious, such as a heart attack or a blood clot in your lungs. Chest pain can also be caused by conditions that are not life-threatening. If you have chest pain, it is very important to follow up with your health care provider. °CAUSES  °Chest pain can be caused by: °· Heartburn. °· Pneumonia or bronchitis. °· Anxiety or stress. °· Inflammation around your heart (pericarditis) or lung (pleuritis or pleurisy). °· A blood clot in your lung. °· A collapsed lung (pneumothorax). It can develop suddenly on its own (spontaneous pneumothorax) or from trauma to the chest. °· Shingles infection (varicella-zoster virus). °· Heart attack. °· Damage to the bones, muscles, and cartilage that make up your chest wall. This can include: °¨ Bruised bones due to injury. °¨ Strained muscles or cartilage due to frequent or repeated coughing or overwork. °¨ Fracture to one or more ribs. °¨ Sore cartilage due to inflammation (costochondritis). °RISK FACTORS  °Risk factors for chest pain may include: °· Activities that increase your risk for trauma or injury to your chest. °· Respiratory infections or conditions that cause frequent coughing. °· Medical conditions or overeating that can cause heartburn. °· Heart disease or family history of heart disease. °· Conditions or health behaviors that increase your risk of developing a blood clot. °· Having had chicken pox (varicella zoster). °SIGNS AND SYMPTOMS °Chest pain can feel like: °· Burning or tingling on the surface of your chest or deep in your chest. °· Crushing, pressure, aching, or squeezing pain. °· Dull or sharp pain that is worse when you move, cough, or take a deep breath. °· Pain that is also felt in your back, neck, shoulder, or arm, or pain that spreads to any of these areas. °Your chest pain may come and go, or it may stay  constant. °DIAGNOSIS °Lab tests or other studies may be needed to find the cause of your pain. Your health care provider may have you take a test called an ambulatory ECG (electrocardiogram). An ECG records your heartbeat patterns at the time the test is performed. You may also have other tests, such as: °· Transthoracic echocardiogram (TTE). During echocardiography, sound waves are used to create a picture of all of the heart structures and to look at how blood flows through your heart. °· Transesophageal echocardiogram (TEE). This is a more advanced imaging test that obtains images from inside your body. It allows your health care provider to see your heart in finer detail. °· Cardiac monitoring. This allows your health care provider to monitor your heart rate and rhythm in real time. °· Holter monitor. This is a portable device that records your heartbeat and can help to diagnose abnormal heartbeats. It allows your health care provider to track your heart activity for several days, if needed. °· Stress tests. These can be done through exercise or by taking medicine that makes your heart beat more quickly. °· Blood tests. °· Imaging tests. °TREATMENT  °Your treatment depends on what is causing your chest pain. Treatment may include: °· Medicines. These may include: °¨ Acid blockers for heartburn. °¨ Anti-inflammatory medicine. °¨ Pain medicine for inflammatory conditions. °¨ Antibiotic medicine, if an infection is present. °¨ Medicines to dissolve blood clots. °¨ Medicines to treat coronary artery disease. °· Supportive care for conditions that do not require medicines. This may include: °¨ Resting. °¨ Applying heat   or cold packs to injured areas. °¨ Limiting activities until pain decreases. °HOME CARE INSTRUCTIONS °· If you were prescribed an antibiotic medicine, finish it all even if you start to feel better. °· Avoid any activities that bring on chest pain. °· Do not use any tobacco products, including  cigarettes, chewing tobacco, or electronic cigarettes. If you need help quitting, ask your health care provider. °· Do not drink alcohol. °· Take medicines only as directed by your health care provider. °· Keep all follow-up visits as directed by your health care provider. This is important. This includes any further testing if your chest pain does not go away. °· If heartburn is the cause for your chest pain, you may be told to keep your head raised (elevated) while sleeping. This reduces the chance that acid will go from your stomach into your esophagus. °· Make lifestyle changes as directed by your health care provider. These may include: °¨ Getting regular exercise. Ask your health care provider to suggest some activities that are safe for you. °¨ Eating a heart-healthy diet. A registered dietitian can help you to learn healthy eating options. °¨ Maintaining a healthy weight. °¨ Managing diabetes, if necessary. °¨ Reducing stress. °SEEK MEDICAL CARE IF: °· Your chest pain does not go away after treatment. °· You have a rash with blisters on your chest. °· You have a fever. °SEEK IMMEDIATE MEDICAL CARE IF:  °· Your chest pain is worse. °· You have an increasing cough, or you cough up blood. °· You have severe abdominal pain. °· You have severe weakness. °· You faint. °· You have chills. °· You have sudden, unexplained chest discomfort. °· You have sudden, unexplained discomfort in your arms, back, neck, or jaw. °· You have shortness of breath at any time. °· You suddenly start to sweat, or your skin gets clammy. °· You feel nauseous or you vomit. °· You suddenly feel light-headed or dizzy. °· Your heart begins to beat quickly, or it feels like it is skipping beats. °These symptoms may represent a serious problem that is an emergency. Do not wait to see if the symptoms will go away. Get medical help right away. Call your local emergency services (911 in the [place] Do not drive yourself to the hospital. °  °This  information is not intended to replace advice given to you by your health care provider. Make sure you discuss any questions you have with your health care provider. °  °Document Released: 09/29/2004 Document Revised: 01/10/2014 Document Reviewed: 07/26/2013 °Elsevier Interactive Patient Education ©2016 [employer] ° °

## 2015-05-18 ENCOUNTER — Telehealth: Payer: Self-pay | Admitting: Family Medicine

## 2015-05-18 NOTE — Telephone Encounter (Signed)
[facility] Night - Client TELEPHONE ADVICE RECORD [facility] Patient Name: Lindsay GeorgesHAZEL Stanko Gender: Female DOB: 09/13/1955 Age: 60 Y 8 M 3 D Return Phone Number: [phone] (Primary), [phone] (Secondary) Address: City/State/Zip: [place] Client [facility] Night - Client Client Site [facility] - Night Physician Berniece AndreasPanosh, Wanda - MD Contact Type Call Who Is Calling Patient / Member / Family / Caregiver Call Type Triage / Clinical Relationship To Patient Self Return Phone Number [phone] (Primary) Chief Complaint Unclassified Symptom Reason for Call Symptomatic / Request for Health Information Initial Comment She is needing to speak with someone regarding some stress she has been having lately PreDisposition InappropriateToAsk Translation No Nurse Assessment Nurse: Trudie ReedBurrell, RN, Tabatha Date/Time (Eastern Time): 05/16/2015 6:03:26 PM Confirm and document reason for call. If symptomatic, describe symptoms. You must click the next button to save text entered. ---She is needing to speak with someone regarding some stress she has been having lately. Caller is caring for an ill mother and husband. States when she takes a deep breath there is pressure in her chest. Has the patient traveled out of the country within the last 30 days? ---No Does the patient have any new or worsening symptoms? ---Yes Will a triage be completed? ---Yes Related visit to physician within the last 2 weeks? ---No Does the PT have any chronic conditions? (i.e. diabetes, asthma, etc.) ---No Is this a behavioral health or substance abuse call? ---Yes Are you having any thoughts or feelings of harming or killing yourself or someone else? ---No Are you currently experiencing any physical discomfort that you think may be related to the use of alcohol or other drugs? (use substance abuse or alcohol abuse guidelines. These  include withdrawal symptoms) ---No Do you worry that you may be hearing or seeing things that others do not? ---No Do you take medications for your condition(s)? ---No PLEASE NOTE: All timestamps contained within this report are represented as [place] Standard Time. CONFIDENTIALTY NOTICE: This fax transmission is intended only for the addressee. It contains information that is legally privileged, confidential or otherwise protected from use or disclosure. If you are not the intended recipient, you are strictly prohibited from reviewing, disclosing, copying using or disseminating any of this information or taking any action in reliance on or regarding this information. If you have received this fax in error, please notify us immediately by telephone so that we can arrange for its return to us. Phone: [phone], Toll-Free: [phone], Fax: [phone] Page: 2 of 2 Call Id: [id number] Guidelines Guideline Title Affirmed Question Affirmed Notes Nurse Date/Time Lamount Cohen(Eastern Time) Chest Pain Taking a deep breath makes pain worse Trudie ReedBurrell, RN, Tabatha 05/16/2015 6:08:07 PM Disp. Time Lamount Cohen(Eastern Time) Disposition Final User 05/16/2015 6:18:05 PM Go to ED Now (or PCP triage) Yes Trudie ReedBurrell, RN, Lang Snowabatha Caller Understands: Yes Disagree/Comply: Disagree Disagree/Comply Reason: Wait and see Care Advice Given Per Guideline GO TO ED NOW (OR PCP TRIAGE): * IF NO PCP TRIAGE: You need to be seen. [facility] within the next hour. Leave as soon as you can. BRING MEDICINES: * Please bring a list of your current medicines when you go to see the doctor. * It is also a good idea to bring the pill bottles too. This will help the doctor to make certain you are taking the right medicines and the right dose. Referrals GO TO FACILITY UNDECIDED GO TO FACILITY UNDECIDED

## 2015-05-18 NOTE — Telephone Encounter (Signed)
FYI.  Pt seen in ED 

## 2015-06-22 ENCOUNTER — Other Ambulatory Visit: Payer: Self-pay | Admitting: Internal Medicine

## 2015-06-23 NOTE — Telephone Encounter (Signed)
Sent to the pharmacy by e-scribe.  Pt has upcoming appt on 08/25/15

## 2015-08-11 ENCOUNTER — Ambulatory Visit (INDEPENDENT_AMBULATORY_CARE_PROVIDER_SITE_OTHER): Payer: Federal, State, Local not specified - PPO | Admitting: Podiatry

## 2015-08-11 ENCOUNTER — Encounter: Payer: Self-pay | Admitting: Podiatry

## 2015-08-11 VITALS — BP 140/81 | HR 93

## 2015-08-11 DIAGNOSIS — B351 Tinea unguium: Secondary | ICD-10-CM | POA: Diagnosis not present

## 2015-08-11 DIAGNOSIS — I890 Lymphedema, not elsewhere classified: Secondary | ICD-10-CM | POA: Diagnosis not present

## 2015-08-11 NOTE — Patient Instructions (Signed)
Seen for hypertrophic nails. All nails debrided. Return in 3 months or as needed.  

## 2015-08-11 NOTE — Progress Notes (Signed)
SUBJECTIVE: 60 y.o. year old female presents requesting painful nails trimmed. She was seen at this office about 10 years ago for routine foot care.      REVIEW OF SYSTEMS: A comprehensive review of systems was negative except for: Lymphedema on left lower limb.  OBJECTIVE: DERMATOLOGIC EXAMINATION: Nails: Hypertrophic nails x 10.   VASCULAR EXAMINATION OF LOWER LIMBS: Pedal pulses: All pedal pulses are palpable with normal pulsation on right.  Positive of lymphedema lower limb left. Temperature gradient from tibial crest to dorsum of foot is within normal bilateral.  NEUROLOGIC EXAMINATION OF THE LOWER LIMBS: All epicritic and tactile sensations grossly intact.  Sharp and Dull discriminatory sensations at the plantar ball of hallux is intact bilateral.   MUSCULOSKELETAL EXAMINATION: No gross deformities other than enlarged left lower limb from chronic lymphedema.  ASSESSMENT: 1. Onychomycosis. 2. Lymphedema.  PLAN: Reviewed findings and available options. Advised to use Lymph compression machine. Patient has one at home. All nails debrided.

## 2015-08-17 ENCOUNTER — Other Ambulatory Visit (INDEPENDENT_AMBULATORY_CARE_PROVIDER_SITE_OTHER): Payer: Federal, State, Local not specified - PPO

## 2015-08-17 DIAGNOSIS — Z0000 Encounter for general adult medical examination without abnormal findings: Secondary | ICD-10-CM | POA: Diagnosis not present

## 2015-08-17 LAB — HEPATIC FUNCTION PANEL
ALBUMIN: 4.1 g/dL (ref 3.5–5.2)
ALT: 17 U/L (ref 0–35)
AST: 18 U/L (ref 0–37)
Alkaline Phosphatase: 72 U/L (ref 39–117)
Bilirubin, Direct: 0.1 mg/dL (ref 0.0–0.3)
Total Bilirubin: 0.4 mg/dL (ref 0.2–1.2)
Total Protein: 6.3 g/dL (ref 6.0–8.3)

## 2015-08-17 LAB — CBC WITH DIFFERENTIAL/PLATELET
BASOS PCT: 1 % (ref 0.0–3.0)
Basophils Absolute: 0 10*3/uL (ref 0.0–0.1)
EOS PCT: 2 % (ref 0.0–5.0)
Eosinophils Absolute: 0.1 10*3/uL (ref 0.0–0.7)
HCT: 37.9 % (ref 36.0–46.0)
Hemoglobin: 12.9 g/dL (ref 12.0–15.0)
LYMPHS ABS: 1.5 10*3/uL (ref 0.7–4.0)
Lymphocytes Relative: 31.1 % (ref 12.0–46.0)
MCHC: 34.2 g/dL (ref 30.0–36.0)
MCV: 86.2 fl (ref 78.0–100.0)
MONO ABS: 0.2 10*3/uL (ref 0.1–1.0)
MONOS PCT: 5 % (ref 3.0–12.0)
NEUTROS ABS: 3 10*3/uL (ref 1.4–7.7)
NEUTROS PCT: 60.9 % (ref 43.0–77.0)
PLATELETS: 259 10*3/uL (ref 150.0–400.0)
RBC: 4.39 Mil/uL (ref 3.87–5.11)
RDW: 13.1 % (ref 11.5–15.5)
WBC: 4.9 10*3/uL (ref 4.0–10.5)

## 2015-08-17 LAB — LIPID PANEL
CHOLESTEROL: 245 mg/dL — AB (ref 0–200)
HDL: 65.8 mg/dL (ref >39.00)
LDL Cholesterol: 168 mg/dL — ABNORMAL HIGH (ref 0–99)
NonHDL: 179.61
TRIGLYCERIDES: 58 mg/dL (ref 0.0–149.0)
Total CHOL/HDL Ratio: 4
VLDL: 11.6 mg/dL (ref 0.0–40.0)

## 2015-08-17 LAB — BASIC METABOLIC PANEL
BUN: 11 mg/dL (ref 6–23)
CO2: 30 meq/L (ref 19–32)
Calcium: 9.6 mg/dL (ref 8.4–10.5)
Chloride: 105 mEq/L (ref 96–112)
Creatinine, Ser: 1.1 mg/dL (ref 0.40–1.20)
GFR: 65.17 mL/min (ref >60.00)
GLUCOSE: 86 mg/dL (ref 70–99)
POTASSIUM: 4.7 meq/L (ref 3.5–5.1)
SODIUM: 143 meq/L (ref 135–145)

## 2015-08-17 LAB — TSH: TSH: 2.34 u[IU]/mL (ref 0.35–4.50)

## 2015-08-24 NOTE — Progress Notes (Signed)
Pre visit review using our clinic review tool, if applicable. No additional management support is needed unless otherwise documented below in the visit note.  Chief Complaint  Patient presents with  . Annual Exam    HPI: Patient  Lindsay Parks  60 y.o. 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 She is recently gone to behavioral health provider Secundino GingerLeslie O'Neill  When she got panicked and placed on Cymbalta and then as needed lorazepam to help get through and is doing some positive things for herself. She has very little time otherwise. Does drink some sweet beverages but otherwise tends to eat  healthy.  In regard to her lymphedema left lower extremity she has had some continued discomfort in the Achilles area but no obvious infection. She was able to find a massage person who is able to help with her leg for therapeutic and felt that that would be good to continue as she has experience with lymphedema.  Is taking hormone replacement therapy 1 patch once a week 30 days is less expensive than the mail away. She has done pretty well without breakthrough hot flashes on this.  Health Maintenance  Topic Date Due  . MAMMOGRAM  11/01/2014  . INFLUENZA VACCINE  08/04/2015  . Hepatitis C Screening  08/24/2016 (Originally 09/13/1955)  . HIV Screening  08/24/2016 (Originally 09/13/1970)  . COLONOSCOPY  05/16/2016  . 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 GI /GU: No adominal pain,  vomiting, change in bowel habits. No blood in the stool. No significant GU symptoms. SKIN/HEME: ,no acute skin rashes suspicious lesions or bleeding. No lymphadenopathy, nodules, masses.  NEURO/ PSYCH:  No neurologic signs such as weakness numbness. No depression anxiety. IMM/ Allergy: No unusual infections.  Allergy .   REST of 12 system review negative except as per HPI   Past Medical History:  Diagnosis Date  . Allergy    SEASONAL  . Anxiety   . Arthritis   . Cataract   . Depression   . GERD (gastroesophageal reflux disease)    stricture 2001  . History of esophageal stricture   . History of pancreatitis   . Hx of colonic polyps   . 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 Meningitis   . UTI (lower urinary tract infection)     Past Surgical History:  Procedure Laterality Date  . CERVICAL DISCECTOMY     c5 and c6  . CHOLECYSTECTOMY    . COLONOSCOPY    . ELBOW SURGERY    . ESOPHAGOGASTRODUODENOSCOPY    . FLEXIBLE SIGMOIDOSCOPY    . PARTIAL HYSTERECTOMY  2005   left hydrosalpinx ovarian cyst  . SHOULDER SURGERY    . TOTAL ABDOMINAL HYSTERECTOMY      Family History  Problem Relation Age of Onset  . Stroke Mother   . Diabetes Mother   . Hypertension Mother   . Heart disease Mother   . Osteoporosis Mother   . Arthritis Mother   . Hyperlipidemia Mother   .  Colon cancer Paternal Grandmother     aunts and uncles  . Glaucoma Father   . Hypertension Father   . Gout Father   . Hyperlipidemia Father   . Pancreatic cancer Paternal Grandfather     lung, and stomach  . Diabetes Other     grandson  . Heart disease Maternal Grandmother   . Kidney disease Paternal Uncle     Social History   Social History  . Marital status: Married    Spouse name: N/A  . Number of children: 2  . Years of education: N/A   Occupational History  . retired    Social History Main Topics  . Smoking status: Never Smoker  . Smokeless tobacco: Never Used  . Alcohol  use Yes     Comment: socially  . Drug use: No  . Sexual activity: Not Asked   Other Topics Concern  . None   Social History Narrative   Regular exercise- yes   Occupation: retired from [employer]   Married husband with parkinson's   2 children   care taking husband parkinson's  And  Elderly mom       Finished degree i    Outpatient Medications Prior to Visit  Medication Sig Dispense Refill  . acetaminophen (TYLENOL) 500 MG tablet Take 500 mg by mouth every 6 (six) hours as needed for mild pain or moderate pain.    . calcium-vitamin D (OSCAL WITH D) 500-200 MG-UNIT per tablet Take 1 tablet by mouth daily.      . diphenhydrAMINE (BENADRYL) 25 MG tablet Take 25 mg by mouth every 8 (eight) hours as needed for allergies.    Marland Kitchen estradiol (VIVELLE-DOT) 0.1 MG/24HR patch APPLY 1 PATCH TO SKIN TWICE A WEEK 8 patch 2  . fluocinonide (LIDEX) 0.05 % cream Apply 1 application topically 2 (two) times daily as needed. For eczema    . zolpidem (AMBIEN) 10 MG tablet Take 1 tablet (10 mg total) by mouth at bedtime. 60 tablet 3   No facility-administered medications prior to visit.      EXAM:  BP 118/80 (BP Location: Right Arm, Patient Position: Sitting, Cuff Size: Normal)   Temp 98.4 F (36.9 C) (Oral)   Ht 5\' 6"  (1.676 m)   Wt 189 lb 6.4 oz (85.9 kg)   BMI 30.57 kg/m   Body mass index is 30.57 kg/m.  Physical Exam: Vital signs reviewed WUJ:WJXB is a well-developed well-nourished alert cooperative    who appearsr stated age in no acute distress.  HEENT: normocephalic atraumatic , Eyes: PERRL EOM's full, conjunctiva clear, Nares: paten,t no deformity discharge or tenderness., Ears: no deformity EAC's clear TMs with normal landmarks. Mouth: clear OP, no lesions, edema.  Moist mucous membranes. Dentition in adequate repair. NECK: supple without masses, thyromegaly or bruits. CHEST/PULM:  Clear to auscultation and percussion breath sounds equal no wheeze , rales or rhonchi. No chest  wall deformities or tenderness. CV: PMI is nondisplaced, S1 S2 no gallops, murmurs, rubs. Peripheral pulses are full without delay.No JVD . Breast: normal by inspection . No dimpling, discharge, masses, tenderness or discharge . ABDOMEN: Bowel sounds normal nontender  No guard or rebound, no hepato splenomegal no CVA tenderness.  No hernia. Extremtities:  4+ ll;emphedema left let  No redenss ut firmness at achilles area  no break in skin    NEURO:  Oriented x3, cranial nerves 3-12 appear to be intact, no obvious focal weakness,gait within normal limits no abnormal reflexes or asymmetrical SKIN: No  acute rashes normal turgor, color, no bruising or petechiae. PSYCH: Oriented, good eye contact, no obvious depression anxiety, cognition and judgment appear normal. LN: no cervical axillary inguinal adenopathy  Lab Results  Component Value Date   WBC 4.9 08/17/2015   HGB 12.9 08/17/2015   HCT 37.9 08/17/2015   PLT 259.0 08/17/2015   GLUCOSE 86 08/17/2015   CHOL 245 (H) 08/17/2015   TRIG 58.0 08/17/2015   HDL 65.80 08/17/2015   LDLDIRECT 149.3 06/11/2012   LDLCALC 168 (H) 08/17/2015   ALT 17 08/17/2015   AST 18 08/17/2015   NA 143 08/17/2015   K 4.7 08/17/2015   CL 105 08/17/2015   CREATININE 1.10 08/17/2015   BUN 11 08/17/2015   CO2 30 08/17/2015   TSH 2.34 08/17/2015    ASSESSMENT AND PLAN:  Discussed the following assessment and plan:  Encounter for preventative adult health care exam with abnormal findings - Overdue get her mammogram colon cancer screening when due. DEXA in the remote future.  MILROY'S DISEASE  Medication management  HLD (hyperlipidemia) - Some worsening reviewed diet and strategies she is a caretaker but she has been able to lose weight.  Postmenopausal HRT (hormone replacement therapy) - On her next refill we can try to go down on her dose as tolerated and she'll call us and she needs a refill. To make the change.  Insomnia - Discussed having her behavioral  health provider take over the prescription for Ambien at this time she stated that was offered and will ask when due   She can contact us if we need to give an order for help with her lymphedema Milroy's disease. Or as needed reviewed healthcare maintenance. Otherwise CPX with labs in a year. Patient Care Team: Madelin HeadingsWanda K Panosh, MD as PCP - General Richarda Overlieichard Holland, MD (Obstetrics and Gynecology) Louis Meckelobert D Kaplan, MD (Gastroenterology) Bufford ButtnerSusan Stinehelfer, MD (Dermatology) Albertina ParrLeslie C O'Neal (Nurse Practitioner) Patient Instructions    Attend to Continue lifestyle intervention healthy eating and exercise .as much possible LIPIDS not as favorable but not alarming .  Attend to your own intake   Avoid processed and simple sugars . I agree if massage is helping for the lymphedema .  Get your mammogram .  At next refill we can try decrasing the patch to 075 or such  Contact us when refilling and we can change dose .   Have  Behavioral health prescribing provider  Take over the ambien  At this time .   Wt Readings from Last 3 Encounters:  08/25/15 189 lb 6.4 oz (85.9 kg)  02/23/15 193 lb 14.4 oz (88 kg)  08/26/14 200 lb 9.6 oz (91 kg)          Wanda K. Panosh M.D.

## 2015-08-25 ENCOUNTER — Encounter: Payer: Self-pay | Admitting: Internal Medicine

## 2015-08-25 ENCOUNTER — Ambulatory Visit (INDEPENDENT_AMBULATORY_CARE_PROVIDER_SITE_OTHER): Payer: Federal, State, Local not specified - PPO | Admitting: Internal Medicine

## 2015-08-25 VITALS — BP 118/80 | Temp 98.4°F | Ht 66.0 in | Wt 189.4 lb

## 2015-08-25 DIAGNOSIS — Q820 Hereditary lymphedema: Secondary | ICD-10-CM | POA: Diagnosis not present

## 2015-08-25 DIAGNOSIS — R6889 Other general symptoms and signs: Secondary | ICD-10-CM

## 2015-08-25 DIAGNOSIS — Z79899 Other long term (current) drug therapy: Secondary | ICD-10-CM | POA: Diagnosis not present

## 2015-08-25 DIAGNOSIS — Z79890 Hormone replacement therapy (postmenopausal): Secondary | ICD-10-CM | POA: Diagnosis not present

## 2015-08-25 DIAGNOSIS — Z0001 Encounter for general adult medical examination with abnormal findings: Secondary | ICD-10-CM

## 2015-08-25 DIAGNOSIS — E785 Hyperlipidemia, unspecified: Secondary | ICD-10-CM

## 2015-08-25 DIAGNOSIS — G4700 Insomnia, unspecified: Secondary | ICD-10-CM

## 2015-08-25 NOTE — Patient Instructions (Signed)
  Attend to Continue lifestyle intervention healthy eating and exercise .as much possible LIPIDS not as favorable but not alarming .  Attend to your own intake   Avoid processed and simple sugars . I agree if massage is helping for the lymphedema .  Get your mammogram .  At next refill we can try decrasing the patch to 075 or such  Contact us when refilling and we can change dose .   Have  Behavioral health prescribing provider  Take over the ambien  At this time .   Wt Readings from Last 3 Encounters:  08/25/15 189 lb 6.4 oz (85.9 kg)  02/23/15 193 lb 14.4 oz (88 kg)  08/26/14 200 lb 9.6 oz (91 kg)

## 2015-09-08 LAB — HM MAMMOGRAPHY

## 2015-09-09 ENCOUNTER — Encounter: Payer: Self-pay | Admitting: Family Medicine

## 2015-09-14 ENCOUNTER — Other Ambulatory Visit: Payer: Self-pay | Admitting: Internal Medicine

## 2015-09-16 NOTE — Telephone Encounter (Signed)
Left a message for a return call.  At next refill we can try decrasing the patch to 075 or such  Contact us when refilling and we can change dose .

## 2015-09-16 NOTE — Telephone Encounter (Signed)
Spoke to the pt.  She has reduced patch to once weekly of 0.1.  Ok to continue or need to reduce to 0.75?

## 2015-09-16 NOTE — Telephone Encounter (Signed)
Yes please decrease to  vivelle dot  0.075 1 patch twice a week  Disp 12 refill x 3

## 2015-09-17 MED ORDER — ESTRADIOL 0.075 MG/24HR TD PTTW
1.0000 | MEDICATED_PATCH | TRANSDERMAL | 3 refills | Status: DC
Start: 2015-09-17 — End: 2016-05-31

## 2015-09-17 NOTE — Telephone Encounter (Signed)
Spoke to the pt and informed her that I have sent in new strength of medication to use twice weekly.  Pt agrees and will pick up new rx.

## 2015-10-12 ENCOUNTER — Encounter: Payer: Self-pay | Admitting: Internal Medicine

## 2015-11-11 ENCOUNTER — Ambulatory Visit (INDEPENDENT_AMBULATORY_CARE_PROVIDER_SITE_OTHER): Payer: Federal, State, Local not specified - PPO | Admitting: Podiatry

## 2015-11-11 ENCOUNTER — Encounter: Payer: Self-pay | Admitting: Podiatry

## 2015-11-11 VITALS — BP 141/81 | HR 84

## 2015-11-11 DIAGNOSIS — M79672 Pain in left foot: Secondary | ICD-10-CM

## 2015-11-11 DIAGNOSIS — B351 Tinea unguium: Secondary | ICD-10-CM

## 2015-11-11 DIAGNOSIS — M79671 Pain in right foot: Secondary | ICD-10-CM

## 2015-11-11 NOTE — Patient Instructions (Signed)
Seen for hypertrophic nails. All nails debrided. Use Salsun blue and scrub pad and rub off the heel at the end of each shower. May add skin cream if needed. Return in 3 months or as needed.

## 2015-11-11 NOTE — Progress Notes (Signed)
SUBJECTIVE: 60 y.o. year old female presents requesting painful nails trimmed. She was seen at this office about 10 years ago for routine foot care.      REVIEW OF SYSTEMS: A comprehensive review of systems was negative except for: Lymphedema on left lower limb.  OBJECTIVE: DERMATOLOGIC EXAMINATION: Hypertrophic nails x 10.  Fissuring dry skin left heel.  VASCULAR EXAMINATION OF LOWER LIMBS: Pedal pulses: All pedal pulses are palpable with normal pulsation on right.  Positive of lymphedema lower limb left. Temperature gradient from tibial crest to dorsum of foot is within normal bilateral.  NEUROLOGIC EXAMINATION OF THE LOWER LIMBS: All epicritic and tactile sensations grossly intact.  Sharp and Dull discriminatory sensations at the plantar ball of hallux is intact bilateral.   MUSCULOSKELETAL EXAMINATION: No gross deformities other than enlarged left lower limb from chronic lymphedema.  ASSESSMENT: 1. Onychomycosis, painful with ambulation. 2. Lymphedema. 3. Peeling skin with fissure left heel.  PLAN: Reviewed findings and available options. All nails debrided. Fissuring callus debrided. Advised to scrub the affected heel with Salsun blue shampoo after each shower.

## 2016-02-11 ENCOUNTER — Ambulatory Visit: Payer: Federal, State, Local not specified - PPO | Admitting: Podiatry

## 2016-03-03 ENCOUNTER — Encounter: Payer: Self-pay | Admitting: *Deleted

## 2016-03-06 ENCOUNTER — Emergency Department (HOSPITAL_COMMUNITY)
Admission: EM | Admit: 2016-03-06 | Discharge: 2016-03-06 | Disposition: A | Discharge status: Home / Self Care | Payer: Federal, State, Local not specified - PPO | Attending: Emergency Medicine | Admitting: Emergency Medicine

## 2016-03-06 ENCOUNTER — Encounter (HOSPITAL_COMMUNITY): Payer: Self-pay

## 2016-03-06 ENCOUNTER — Emergency Department (HOSPITAL_COMMUNITY): Payer: Federal, State, Local not specified - PPO

## 2016-03-06 DIAGNOSIS — M779 Enthesopathy, unspecified: Secondary | ICD-10-CM

## 2016-03-06 DIAGNOSIS — M79662 Pain in left lower leg: Secondary | ICD-10-CM | POA: Diagnosis present

## 2016-03-06 DIAGNOSIS — M76822 Posterior tibial tendinitis, left leg: Secondary | ICD-10-CM | POA: Diagnosis not present

## 2016-03-06 NOTE — ED Triage Notes (Signed)
Patient complains of left ankle pain x 1 year. States that she thinks related to achilles, awoke her from sleep last pm. 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

## 2016-03-06 NOTE — ED Provider Notes (Signed)
[facility]-EMERGENCY DEPT Provider Note   CSN: [id number] Arrival date & time: 03/06/16  [id number]  By signing my name below, I, [name], attest that this documentation has been prepared under the direction and in the presence of [facility] Hospital[facility] Vernelle Wisner, PA-C.  Electronically Signed: [name], ED Scribe. 03/06/16. 11:48 AM.  History   Chief Complaint Chief Complaint  Patient presents with  . Ankle Pain   The history is provided by the patient. No language interpreter was used.    HPI Comments: Lindsay Parks is a 61 y.o. female who presents to the Emergency Department complaining of intermittent left lower leg pain beginning one year ago, worsening since last night. Pt has had similar episodes of pain 3-4 episodes a week over the past year, however, her pain that began last night is more severe than usual and has been persistent since. She describes her pain as burning/stretching, and notes radiation from the medial portion of her left ankle upwards into calf. No recent trauma, injury, new medications (including FQ/steroids) or new activity prior to the onset of her worsening pain. Pt has a h/o lymphedema to her left lower leg since childhood, but she states that there has not previously had pain to the area associated with this issue. No recent acute change to her swelling of the LLE. No treatments for her pain were tried prior to coming into the ED. Pt denies numbness, fever, or any other associated symptoms.   Past Medical History:  Diagnosis Date  . Allergy    SEASONAL  . Anxiety   . Arthritis   . Cataract   . Depression   . GERD (gastroesophageal reflux disease)    stricture 2001  . History of esophageal stricture   . History of pancreatitis   . Hx of colonic polyps   . 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 Meningitis   . UTI (lower urinary tract infection)    Patient Active Problem List   Diagnosis Date Noted  . Hx of cervical spine surgery 02/06/2014    . Acquired deformity of neck 06/11/2013  . Left Achilles tendinitis? bursitis 06/11/2013  . Routine general medical examination at a health care facility 06/11/2013  . Stress due to illness of family member 12/12/2012  . Hx of bone density study 12/12/2012  . Edema 05/08/2012  . Cervical disc disease 05/08/2012  . Abdominal pain, right lower quadrant 05/09/2011  . Menopausal hot flushes 11/16/2010  . Eczema 05/01/2010  . LOCALIZED ADIPOSITY 02/10/2009  . WEIGHT GAIN, ABNORMAL 02/10/2009  . MILROY'S DISEASE 04/08/2008  . NECK PAIN 09/24/2007  . LYMPHEDEMA 12/12/2006  . OSTEOPENIA 12/12/2006  . Insomnia 12/12/2006  . HYPERLIPIDEMIA 12/05/2006  . DEPRESSION 08/21/2006  . ALLERGIC RHINITIS 08/21/2006  . GERD 08/21/2006  . COLONIC POLYPS, HX OF 08/21/2006   Past Surgical History:  Procedure Laterality Date  . CERVICAL DISCECTOMY     c5 and c6  . CHOLECYSTECTOMY    . COLONOSCOPY    . ELBOW SURGERY    . ESOPHAGOGASTRODUODENOSCOPY    . FLEXIBLE SIGMOIDOSCOPY    . PARTIAL HYSTERECTOMY  2005   left hydrosalpinx ovarian cyst  . SHOULDER SURGERY    . TOTAL ABDOMINAL HYSTERECTOMY     OB History    No data available     Home Medications    Prior to Admission medications   Medication Sig Start Date End Date Taking? Authorizing Provider  acetaminophen (TYLENOL) 500 MG tablet  Take 500 mg by mouth every 6 (six) hours as needed for mild pain or moderate pain.    Historical Provider, MD  calcium-vitamin D (OSCAL WITH D) 500-200 MG-UNIT per tablet Take 1 tablet by mouth daily.      Historical Provider, MD  diphenhydrAMINE (BENADRYL) 25 MG tablet Take 25 mg by mouth every 8 (eight) hours as needed for allergies.    Historical Provider, MD  DULoxetine (CYMBALTA) 60 MG capsule Take 1 capsule by mouth daily. 08/17/15   Historical Provider, MD  estradiol (VIVELLE-DOT) 0.075 MG/24HR Place 1 patch onto the skin 2 (two) times a week. 09/17/15   [name], MD  estradiol (VIVELLE-DOT) 0.1  MG/24HR patch APPLY 1 PATCH TO SKIN TWICE A WEEK 06/23/15   [name], MD  fluocinonide (LIDEX) 0.05 % cream Apply 1 application topically 2 (two) times daily as needed. For eczema    Historical Provider, MD  LORazepam (ATIVAN) 1 MG tablet Take 0.5 mg by mouth daily as needed for anxiety.    Historical Provider, MD  zolpidem (AMBIEN) 10 MG tablet Take 1 tablet (10 mg total) by mouth at bedtime. 02/23/15   [name], MD   Family History Family History  Problem Relation Age of Onset  . Stroke Mother   . Diabetes Mother   . Hypertension Mother   . Heart disease Mother   . Osteoporosis Mother   . Arthritis Mother   . Hyperlipidemia Mother   . Glaucoma Father   . Hypertension Father   . Gout Father   . Hyperlipidemia Father   . Colon cancer Paternal Grandmother     aunts and uncles  . Pancreatic cancer Paternal Grandfather     lung, and stomach  . Diabetes Other     grandson  . Heart disease Maternal Grandmother   . Kidney disease Paternal Uncle    Social History Social History  Substance Use Topics  . Smoking status: Never Smoker  . Smokeless tobacco: Never Used  . Alcohol use Yes     Comment: socially   Allergies   Patient has no known allergies.  Review of Systems Review of Systems  Constitutional: Negative for fever.  Musculoskeletal: Positive for arthralgias (left ankle pain), joint swelling and myalgias.  Neurological: Negative for numbness.   Physical Exam Updated Vital Signs BP 147/80 (BP Location: Left Arm)   Pulse 75   Temp 97.9 F (36.6 C) (Oral)   Resp 14   SpO2 100%   Physical Exam  Constitutional: She appears well-developed and well-nourished. No distress.  HENT:  Head: Normocephalic and atraumatic.  Neck: Neck supple.  Cardiovascular: Normal rate, regular rhythm and normal heart sounds.   No murmur heard. Pulmonary/Chest: Effort normal and breath sounds normal. No respiratory distress. She has no wheezes.  Musculoskeletal: Normal range  of motion. She exhibits tenderness.       Legs: +edema which pt states is baseline d/t her chronic lymphedema. TTP as depicted in image. No palpable defect. Negative Thompson's test. Full ROM. Good pulses throughout the left lower extremity, good cap refill. Able to move all toes without difficulty. Sensation is intact throughout. No erythema or warmth.   Neurological: She is alert.  Skin: Skin is warm and dry.  Nursing note and vitals reviewed.  ED Treatments / Results  DIAGNOSTIC STUDIES: Oxygen Saturation is 100% on RA, normal by my interpretation.   COORDINATION OF CARE: 11:48 AM-Discussed next steps with pt. Pt verbalized understanding and is agreeable with the  plan.   Labs (all labs ordered are listed, but only abnormal results are displayed) Labs Reviewed - No data to display  EKG  EKG Interpretation None      Radiology Dg Ankle Complete Left  Result Date: 03/06/2016 CLINICAL DATA:  Pain without trauma. EXAM: LEFT ANKLE COMPLETE - 3+ VIEW COMPARISON:  None. FINDINGS: Diffuse soft tissue thickening. No bony abnormalities are identified to suggest a cause for the patient's pain. No fracture, dislocation, or bony erosion. IMPRESSION: Diffuse soft-tissue swelling with no underlying bony abnormality. Electronically Signed   By: Gerome Sam III M.D   On: 03/06/2016 11:16   Procedures Procedures   Medications Ordered in ED Medications - No data to display  Initial Impression / Assessment and Plan / ED Course  I have reviewed the triage vital signs and the nursing notes.  Pertinent labs & imaging results that were available during my care of the patient were reviewed by me and considered in my medical decision making (see chart for details).    ZABRIA LISS is a 61 y.o. female who presents to ED for left distal posterior leg pain. On exam, LLE is NVI. She does have tenderness over the Achilles tendon. Negative Thompson. No palpable defect. Full ROM. Will place in cam  walker and have patient follow up with ortho. Patient followed by [facility] and will call them in the morning to arrange follow up. Home care and follow up instructions discussed. All questions answered.   Final Clinical Impressions(s) / ED Diagnoses   Final diagnoses:  Tendonitis   New Prescriptions Discharge Medication List as of 03/06/2016 11:58 AM     I personally performed the services described in this documentation, which was scribed in my presence. The recorded information has been reviewed and is accurate.     [facility] Lorell Thibodaux, PA-C 03/06/16 1412    Rolan Bucco, MD 03/06/16 [phone]

## 2016-03-06 NOTE — Discharge Instructions (Signed)
Ice affected areas for pain. Elevate extremity for improvement of pain/swelling. Please call the orthopedic clinic listed tomorrow morning to schedule a follow-up appointment. Return to ER for new or worsening symptoms, any additional concerns.

## 2016-03-31 ENCOUNTER — Encounter: Payer: Self-pay | Admitting: Gastroenterology

## 2016-04-07 ENCOUNTER — Other Ambulatory Visit: Payer: Self-pay

## 2016-04-07 DIAGNOSIS — I890 Lymphedema, not elsewhere classified: Secondary | ICD-10-CM

## 2016-05-11 ENCOUNTER — Ambulatory Visit: Payer: Federal, State, Local not specified - PPO | Admitting: Podiatry

## 2016-05-19 ENCOUNTER — Ambulatory Visit: Payer: Federal, State, Local not specified - PPO | Admitting: Podiatry

## 2016-05-20 ENCOUNTER — Encounter: Payer: Self-pay | Admitting: Vascular Surgery

## 2016-05-31 ENCOUNTER — Ambulatory Visit (INDEPENDENT_AMBULATORY_CARE_PROVIDER_SITE_OTHER): Payer: Federal, State, Local not specified - PPO | Admitting: Vascular Surgery

## 2016-05-31 ENCOUNTER — Ambulatory Visit (HOSPITAL_COMMUNITY)
Admission: RE | Admit: 2016-05-31 | Discharge: 2016-05-31 | Disposition: A | Discharge status: Home / Self Care | Payer: Federal, State, Local not specified - PPO | Source: Ambulatory Visit | Attending: Vascular Surgery | Admitting: Vascular Surgery

## 2016-05-31 ENCOUNTER — Encounter: Payer: Self-pay | Admitting: Vascular Surgery

## 2016-05-31 VITALS — BP 115/74 | HR 91 | Temp 98.1°F | Resp 16 | Ht 66.0 in | Wt 194.0 lb

## 2016-05-31 DIAGNOSIS — I890 Lymphedema, not elsewhere classified: Secondary | ICD-10-CM | POA: Diagnosis present

## 2016-05-31 DIAGNOSIS — I8392 Asymptomatic varicose veins of left lower extremity: Secondary | ICD-10-CM | POA: Diagnosis not present

## 2016-05-31 NOTE — Progress Notes (Signed)
Reason for referral: Swollen left leg Requesting Physician: Dr. Victorino DikeHewitt   History of Present Illness  Lindsay Parks is a 61 y.o. female who presents with chief complaint: swollen leg.  Patient notes, onset of swelling age 61.  The patient has had no history of DVT, no history of varicose vein, no history of venous stasis ulcers, positive history of  Lymphedema and no history of skin changes in lower legs.   The patient has  used compression stockings in the past and continues to use them daily.  She has been in Lymphedema therapy in the past.  Other past medical history includes: arthritis, depression, she denise DM, HTN and hyperlipidemia.    Past Medical History:  Diagnosis Date  . Allergy    SEASONAL  . Anxiety   . Arthritis   . Cataract   . Depression   . GERD (gastroesophageal reflux disease)    stricture 2001  . History of esophageal stricture   . History of pancreatitis   . Hx of colonic polyps   . 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 Meningitis   . UTI (lower urinary tract infection)     Past Surgical History:  Procedure Laterality Date  . CERVICAL DISCECTOMY     c5 and c6  . CHOLECYSTECTOMY    . COLONOSCOPY    . ELBOW SURGERY    . ESOPHAGOGASTRODUODENOSCOPY    . FLEXIBLE SIGMOIDOSCOPY    . PARTIAL HYSTERECTOMY  2005   left hydrosalpinx ovarian cyst  . SHOULDER SURGERY    . TOTAL ABDOMINAL HYSTERECTOMY      Social History   Social History  . Marital status: Married    Spouse name: N/A  . Number of children: 2  . Years of education: N/A   Occupational History  . retired    Social History Main Topics  . Smoking status: Never Smoker  . Smokeless tobacco: Never Used  . Alcohol use Yes     Comment: socially  . Drug use: No  . Sexual activity: Not on file   Other Topics Concern  . Not on file   Social History Narrative   Regular exercise- yes   Occupation: retired from [employer]   Married husband with parkinson's     2 children   care taking husband parkinson's  And  Elderly mom       Finished degree i    Family History  Problem Relation Age of Onset  . Stroke Mother   . Diabetes Mother   . Hypertension Mother   . Heart disease Mother   . Osteoporosis Mother   . Arthritis Mother   . Hyperlipidemia Mother   . Glaucoma Father   . Hypertension Father   . Gout Father   . Hyperlipidemia Father   . Colon cancer Paternal Grandmother        aunts and uncles  . Pancreatic cancer Paternal Grandfather        lung, and stomach  . Diabetes Other        grandson  . Heart disease Maternal Grandmother   . Kidney disease Paternal Uncle     Current Outpatient Prescriptions on File Prior to Visit  Medication Sig Dispense Refill  . acetaminophen (TYLENOL) 500 MG tablet Take 500 mg by mouth every 6 (six) hours as needed for mild pain or moderate pain.    . calcium-vitamin D (OSCAL WITH D) 500-200 MG-UNIT per tablet  Take 1 tablet by mouth daily.      . diphenhydrAMINE (BENADRYL) 25 MG tablet Take 25 mg by mouth every 8 (eight) hours as needed for allergies.    . DULoxetine (CYMBALTA) 60 MG capsule Take 1 capsule by mouth daily.    Marland Kitchen estradiol (VIVELLE-DOT) 0.1 MG/24HR patch APPLY 1 PATCH TO SKIN TWICE A WEEK 8 patch 2  . fluocinonide (LIDEX) 0.05 % cream Apply 1 application topically 2 (two) times daily as needed. For eczema    . LORazepam (ATIVAN) 1 MG tablet Take 0.5 mg by mouth daily as needed for anxiety.    Marland Kitchen zolpidem (AMBIEN) 10 MG tablet Take 1 tablet (10 mg total) by mouth at bedtime. 60 tablet 3   No current facility-administered medications on file prior to visit.     Allergies as of 05/31/2016  . (No Known Allergies)     ROS:   General:  No weight loss, Fever, chills  HEENT: No recent headaches, no nasal bleeding, no visual changes, no sore throat  Neurologic: No dizziness, blackouts, seizures. No recent symptoms of stroke or mini- stroke. No recent episodes of slurred speech, or  temporary blindness.  Cardiac: No recent episodes of chest pain/pressure, no shortness of breath at rest.  No shortness of breath with exertion.  Denies history of atrial fibrillation or irregular heartbeat  Vascular: No history of rest pain in feet.  No history of claudication.  No history of non-healing ulcer, No history of DVT   Pulmonary: No home oxygen, no productive cough, no hemoptysis,  No asthma or wheezing  Musculoskeletal:  [x ] Arthritis, [ ]  Low back pain,  [ ]  Joint pain  Hematologic:No history of hypercoagulable state.  No history of easy bleeding.  No history of anemia  Gastrointestinal: No hematochezia or melena,  No gastroesophageal reflux, no trouble swallowing  Urinary: [ ]  chronic Kidney disease, [ ]  on HD - [ ]  MWF or [ ]  TTHS, [ ]  Burning with urination, [ ]  Frequent urination, [ ]  Difficulty urinating;   Skin: No rashes  Psychological: No history of anxiety,  positive history of depression  Physical Examination  Vitals:   05/31/16 1416  BP: 115/74  Pulse: 91  Resp: 16  Temp: 98.1 F (36.7 C)  TempSrc: Oral  SpO2: 99%  Weight: 194 lb (88 kg)  Height: 5\' 6"  (1.676 m)    Body mass index is 31.31 kg/m.  General:  Alert and oriented, no acute distress HEENT: Normal Neck: No bruit or JVD Pulmonary: Clear to auscultation bilaterally Cardiac: Regular Rate and Rhythm without murmur Abdomen: Soft, non-tender, non-distended, no mass, no scars Skin: No rash Extremity Pulses:  2+ radial, brachial, femoral,  Right dorsalis pedis, posterior tibial pulses.  Non palpable left pedal pulses due to edema.  Active range of motion and sensation are intact. Musculoskeletal: sever edema left LE without ulcer or open wounds.    Neurologic: Upper and lower extremity motor 5/5 grossly with exception on the left due to size from edema.  DATA: Venous duplex: 05/31/2016 Deep system with mild venous insufficiency CFV and popliteal reflux  Assessment: Chronic Primary  Lymphedema   Plan: She has done well to control her edema with compression daily and therapy in the past.  This is primary Lymphedema without another cause that can be fixed surgically.  The best thing she can do is continue with compression daily and therapy as needed.  She will contact us in the future if she has any problems  or needs a therapy referral.    Raciel Caffrey Lindsay [facility]   The patient was seen in conjunction with Dr. Arbie Cookey today.  I have examined the patient, reviewed and agree with above.  Gretta Began, MD 05/31/2016 4:39 PM  The patient has classic primary lymphedema. Apparently underwent some type of limp angiogram is a young person with a small incision on the dorsum of her left foot. Has been progressive over the years. Is extremely compliant with custom made compression garments and wears these a day and night 7 days a week. Her venous study today revealed some deep venous insufficiency and I explained to her that this is irrelevant given her lymphedema. Explained that treatment would be compression and elevation for her deep venous insufficiency. She has tried therapeutic massage in the past with some success 11 encouraged her to reinitiate this. She will see [place] again on an as-needed basis

## 2016-06-17 ENCOUNTER — Telehealth: Payer: Self-pay | Admitting: Internal Medicine

## 2016-06-17 DIAGNOSIS — Z0289 Encounter for other administrative examinations: Secondary | ICD-10-CM

## 2016-06-17 NOTE — Telephone Encounter (Signed)
Patient dropped off handicap placard form. Disposition- Dr's folder Call patient for pickup at [phone]

## 2016-06-21 NOTE — Telephone Encounter (Signed)
Form filled out and routed to Dr. Demetrius CharityP.

## 2016-06-23 NOTE — Telephone Encounter (Signed)
The patient picked up her handicap form and paid the form fee

## 2016-06-23 NOTE — Telephone Encounter (Signed)
Pt notified that her paper work is available for pick up at the front desk.  Made a copy for the chart and a copy retained at my desk.

## 2016-09-23 ENCOUNTER — Encounter: Payer: Self-pay | Admitting: Internal Medicine

## 2016-09-26 ENCOUNTER — Other Ambulatory Visit: Payer: Self-pay | Admitting: Internal Medicine

## 2016-09-30 NOTE — Telephone Encounter (Signed)
Medication filled to pharmacy as requested.   

## 2016-10-27 LAB — HM MAMMOGRAPHY

## 2016-11-22 ENCOUNTER — Encounter: Payer: Self-pay | Admitting: Internal Medicine

## 2016-11-28 ENCOUNTER — Telehealth: Payer: Self-pay | Admitting: Internal Medicine

## 2016-11-28 NOTE — Telephone Encounter (Signed)
Ok to refill 

## 2016-11-28 NOTE — Telephone Encounter (Signed)
Ok to fill 

## 2016-11-28 NOTE — Telephone Encounter (Signed)
Copied from CRM #[id number]. Topic: General - Other >> Nov 28, 2016  9:56 AM Raquel SarnaHayes, Teresa G wrote: Out of all medication - pt cut nails and nicked herself. Needs medication filled asap  PRN - Kfelx - antibiotiec  [facility] [address]

## 2016-11-28 NOTE — Telephone Encounter (Signed)
Anti biotic   Request

## 2016-11-30 MED ORDER — CEPHALEXIN 500 MG PO CAPS: 500.0000 mg | ORAL_CAPSULE | Freq: Four times a day (QID) | ORAL | 0 refills | Status: DC

## 2016-11-30 NOTE — Telephone Encounter (Signed)
Medication has been refilled.

## 2017-01-08 ENCOUNTER — Other Ambulatory Visit: Payer: Self-pay | Admitting: Internal Medicine

## 2017-03-06 NOTE — H&P (Addendum)
Patient ID: Lindsay Parks MRN: [id number] DOB/AGE: 09/11/1955 62 y.o.  Admit date: (Not on file)  Admission Diagnoses:  Adjacent Segment Cervical Degenerative Disc Disease  HPI: Pt is scheduled to have a C6-7 ACDF because of adjacent segment disease.  Pt has a hx of good health.   Past Medical History: Past Medical History:  Diagnosis Date  . Allergy    SEASONAL  . Anxiety   . Arthritis   . Cataract   . Depression   . GERD (gastroesophageal reflux disease)    stricture 2001  . History of esophageal stricture   . History of pancreatitis   . Hx of colonic polyps   . 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 Meningitis   . UTI (lower urinary tract infection)     Surgical History: Past Surgical History:  Procedure Laterality Date  . CERVICAL DISCECTOMY     c5 and c6  . CHOLECYSTECTOMY    . COLONOSCOPY    . ELBOW SURGERY    . ESOPHAGOGASTRODUODENOSCOPY    . FLEXIBLE SIGMOIDOSCOPY    . PARTIAL HYSTERECTOMY  2005   left hydrosalpinx ovarian cyst  . SHOULDER SURGERY    . TOTAL ABDOMINAL HYSTERECTOMY      Family History: Family History  Problem Relation Age of Onset  . Stroke Mother   . Diabetes Mother   . Hypertension Mother   . Heart disease Mother   . Osteoporosis Mother   . Arthritis Mother   . Hyperlipidemia Mother   . Glaucoma Father   . Hypertension Father   . Gout Father   . Hyperlipidemia Father   . Colon cancer Paternal Grandmother        aunts and uncles  . Pancreatic cancer Paternal Grandfather        lung, and stomach  . Diabetes Other        grandson  . Heart disease Maternal Grandmother   . Kidney disease Paternal Uncle     Social History: Social History   Socioeconomic History  . Marital status: Married    Spouse name: Not on file  . Number of children: 2  . Years of education: Not on file  . Highest education level: Not on file  Social Needs  . Financial resource strain: Not on file  . Food insecurity -  worry: Not on file  . Food insecurity - inability: Not on file  . Transportation needs - medical: Not on file  . Transportation needs - non-medical: Not on file  Occupational History  . Occupation: retired  Tobacco Use  . Smoking status: Never Smoker  . Smokeless tobacco: Never Used  Substance and Sexual Activity  . Alcohol use: Yes    Comment: socially  . Drug use: No  . Sexual activity: Not on file  Other Topics Concern  . Not on file  Social History Narrative   Regular exercise- yes   Occupation: retired from [employer]   Married husband with parkinson's   2 children   care taking husband parkinson's  And  Elderly mom       Finished degree i    Allergies: Patient has no known allergies.  Medications: I have reviewed the patient's current medications.  Vital Signs: No data found.  Radiology: No results found.  Labs: No results for input(s): WBC, RBC, HCT, PLT in the last 72 hours. No results for input(s): NA, K, CL, CO2, BUN, CREATININE, GLUCOSE, CALCIUM in  the last 72 hours. No results for input(s): LABPT, INR in the last 72 hours.  Review of Systems: ROS  Physical Exam: There is no height or weight on file to calculate BMI.  Physical Exam  Constitutional: She is oriented to person, place, and time. She appears well-developed and well-nourished.  HENT:  Head: Normocephalic.  Eyes: Pupils are equal, round, and reactive to light.  Cardiovascular: Normal rate, regular rhythm and normal heart sounds.  Respiratory: Effort normal and breath sounds normal.  GI: Soft. Bowel sounds are normal.  Neurological: She is alert and oriented to person, place, and time.  Skin: Skin is warm and dry.  Psychiatric: She has a normal mood and affect. Her behavior is normal. Judgment and thought content normal.   Negative: skin lesions abrasions contusions Peripheral pulses: 2+ and symmetrical in the upper and lower extremity. Compartment soft and nontender. Gait pattern:  Normal gait pattern  Assistive devices: No assistive devices Neuro: She has numbness and dysesthesias in the right C7 dermatome.  Positive Lhermitte sign in the right C7 dermatome.  5 out of 5 strength in the upper extremity.  Moderate neck pain with range of motion.  Pain is largely radiating into the right trapezius and into the right upper extremity.  No significant shoulder, elbow, wrist pain with isolated joint range of motion.  Assessment and Plan: X-rays taken today in the office 2 view cervical spine compared to those of November 11, 2014.  Patient has a solid 2 level ACDF C4-5 and C5-6.  No hardware complications, no subsidence of the grafts.  Patient still has adjacent segment C6-7 degenerative disc disease which does not appear to have progressed since 2016 on imaging studies.  On the MRI 02/2017 - Patient has mild/moderate disc bulging osteophyte complex moderate osteophytosis more pronounced on the right without central stenosis or flattening of the cord.  Surgical changes at C4-5 and C5-6.  Straightening of the normal cervical lordosis.  Mild facet disease at C2-3 severe facet disease at C3-4  Risks and benefits of surgery were discussed with the patient. These include: Infection, bleeding, death, stroke, paralysis, ongoing or worse pain, need for additional surgery, nonunion, leak of spinal fluid, adjacent segment degeneration requiring additional fusion surgery. Pseudoarthrosis (nonunion)requiring supplemental posterior fixation. Throat pain, swallowing difficulties, hoarseness or change in voice.   With respect to disc replacement: Additional risks include heterotopic ossification, inability to place the disc due to technical issues requiring bailout to a fusion procedure.  Anette Riedelarmen Mayo, PAC for Venita Lickahari Hester Forget, MD [facility] [phone]  Patient continues to have significant neck and neuropathic right arm pain.  Attempts at conservative management had failed to elicit  positive results.  As result we are moving forward with ACDF at C6-7.  We will use the 0 profile device so we can avoid needing to remove the plate from her previous surgery.  All appropriate risks benefits and alternatives were discussed with the patient and her family and consent was obtained.

## 2017-03-08 ENCOUNTER — Other Ambulatory Visit (HOSPITAL_COMMUNITY): Payer: Self-pay | Admitting: *Deleted

## 2017-03-08 ENCOUNTER — Encounter (HOSPITAL_COMMUNITY)
Admission: RE | Admit: 2017-03-08 | Discharge: 2017-03-08 | Disposition: A | Discharge status: Home / Self Care | Payer: Federal, State, Local not specified - PPO | Source: Ambulatory Visit | Attending: Orthopedic Surgery | Admitting: Orthopedic Surgery

## 2017-03-08 ENCOUNTER — Encounter (HOSPITAL_COMMUNITY): Payer: Self-pay

## 2017-03-08 ENCOUNTER — Other Ambulatory Visit: Payer: Self-pay

## 2017-03-08 DIAGNOSIS — Z01812 Encounter for preprocedural laboratory examination: Secondary | ICD-10-CM | POA: Insufficient documentation

## 2017-03-08 HISTORY — DX: Other specified postprocedural states: R11.2

## 2017-03-08 HISTORY — DX: Other specified postprocedural states: Z98.890

## 2017-03-08 LAB — CBC
HCT: 39.4 % (ref 36.0–46.0)
Hemoglobin: 12.9 g/dL (ref 12.0–15.0)
MCH: 29.4 pg (ref 26.0–34.0)
MCHC: 32.7 g/dL (ref 30.0–36.0)
MCV: 89.7 fL (ref 78.0–100.0)
PLATELETS: 244 10*3/uL (ref 150–400)
RBC: 4.39 MIL/uL (ref 3.87–5.11)
RDW: 13.4 % (ref 11.5–15.5)
WBC: 5.5 10*3/uL (ref 4.0–10.5)

## 2017-03-08 LAB — SURGICAL PCR SCREEN
MRSA, PCR: NEGATIVE
Staphylococcus aureus: NEGATIVE

## 2017-03-08 NOTE — Progress Notes (Signed)
Pt denies cardiac history, HTN, or diabetes.  

## 2017-03-08 NOTE — Pre-Procedure Instructions (Signed)
Cecelia ByarsHazel G Sorbo  03/08/2017    Your procedure is scheduled on Thursday, March 16, 2017 at 2:00 PM.   [facility] at 12 noon.   Call this number if you have problems the morning of surgery: [phone]   Questions prior to day of surgery, please call [phone] between 8 & 4 PM.   Remember:  Do not eat food or drink liquids after midnight Wednesday, 03/15/17.  Take these medicines the morning of surgery with A SIP OF WATER: Duloxetine (Cymbalta), Lorazepam (Ativan), Tylenol - if needed  Stop Aspirin products (BC powders) and NSAIDS (Aleve, Naprosyn, Ibuprofen, etc) 5 days prior to surgery.   Do not wear jewelry, make-up or nail polish.  Do not wear lotions, powders, perfumes or deodorant.  Do not shave 48 hours prior to surgery.    Do not bring valuables to the hospital.  [facility] is not responsible for any belongings or valuables.  Contacts, dentures or bridgework may not be worn into surgery.  Leave your suitcase in the car.  After surgery it may be brought to your room.  For patients admitted to the hospital, discharge time will be determined by your treatment team.  Patients discharged the day of surgery will not be allowed to drive home.   [place] - Preparing for Surgery  Before surgery, you can play an important role.  Because skin is not sterile, your skin needs to be as free of germs as possible.  You can reduce the number of germs on you skin by washing with CHG (chlorahexidine gluconate) soap before surgery.  CHG is an antiseptic cleaner which kills germs and bonds with the skin to continue killing germs even after washing.  Please DO NOT use if you have an allergy to CHG or antibacterial soaps.  If your skin becomes reddened/irritated stop using the CHG and inform your nurse when you arrive at Short Stay.  Do not shave (including legs and underarms) for at least 48 hours prior to the first CHG shower.  You may  shave your face.  Please follow these instructions carefully:   1.  Shower with CHG Soap the night before surgery and the                    morning of Surgery.  2.  If you choose to wash your hair, wash your hair first as usual with your       normal shampoo.  3.  After you shampoo, rinse your hair and body thoroughly to remove the shampoo.  4.  Use CHG as you would any other liquid soap.  You can apply chg directly       to the skin and wash gently with scrungie or a clean washcloth.  5.  Apply the CHG Soap to your body ONLY FROM THE NECK DOWN.        Do not use on open wounds or open sores.  Avoid contact with your eyes, ears, mouth and genitals (private parts).  Wash genitals (private parts) with your normal soap.  6.  Wash thoroughly, paying special attention to the area where your surgery        will be performed.  7.  Thoroughly rinse your body with warm water from the neck down.  8.  DO NOT shower/wash with your normal soap after using and rinsing off       the CHG Soap.  9.  Pat yourself  dry with a clean towel.            10.  Wear clean pajamas.            11.  Place clean sheets on your bed the night of your first shower and do not        sleep with pets.  Day of Surgery  Shower as above. Do not apply any lotions/deodorants the morning of surgery.  Please wear clean clothes to the hospital.   Please read over the fact sheets that you were given.

## 2017-03-16 ENCOUNTER — Ambulatory Visit (HOSPITAL_COMMUNITY): Payer: Federal, State, Local not specified - PPO

## 2017-03-16 ENCOUNTER — Ambulatory Visit (HOSPITAL_COMMUNITY)
Admission: RE | Disposition: A | Discharge status: Home / Self Care | Payer: Self-pay | Source: Ambulatory Visit | Attending: Orthopedic Surgery

## 2017-03-16 ENCOUNTER — Observation Stay (HOSPITAL_COMMUNITY)
Admission: RE | Admit: 2017-03-16 | Discharge: 2017-03-17 | Disposition: A | Discharge status: Home / Self Care | Payer: Federal, State, Local not specified - PPO | Source: Ambulatory Visit | Attending: Orthopedic Surgery | Admitting: Orthopedic Surgery

## 2017-03-16 ENCOUNTER — Ambulatory Visit (HOSPITAL_COMMUNITY): Payer: Federal, State, Local not specified - PPO | Admitting: Certified Registered Nurse Anesthetist

## 2017-03-16 ENCOUNTER — Encounter (HOSPITAL_COMMUNITY): Payer: Self-pay

## 2017-03-16 DIAGNOSIS — Z981 Arthrodesis status: Secondary | ICD-10-CM

## 2017-03-16 DIAGNOSIS — F419 Anxiety disorder, unspecified: Secondary | ICD-10-CM | POA: Diagnosis not present

## 2017-03-16 DIAGNOSIS — Z7982 Long term (current) use of aspirin: Secondary | ICD-10-CM | POA: Diagnosis not present

## 2017-03-16 DIAGNOSIS — G8918 Other acute postprocedural pain: Secondary | ICD-10-CM | POA: Diagnosis not present

## 2017-03-16 DIAGNOSIS — Z9049 Acquired absence of other specified parts of digestive tract: Secondary | ICD-10-CM | POA: Diagnosis not present

## 2017-03-16 DIAGNOSIS — F329 Major depressive disorder, single episode, unspecified: Secondary | ICD-10-CM | POA: Insufficient documentation

## 2017-03-16 DIAGNOSIS — R29898 Other symptoms and signs involving the musculoskeletal system: Secondary | ICD-10-CM | POA: Insufficient documentation

## 2017-03-16 DIAGNOSIS — Z79899 Other long term (current) drug therapy: Secondary | ICD-10-CM | POA: Insufficient documentation

## 2017-03-16 DIAGNOSIS — Z8661 Personal history of infections of the central nervous system: Secondary | ICD-10-CM | POA: Diagnosis not present

## 2017-03-16 DIAGNOSIS — M50123 Cervical disc disorder at C6-C7 level with radiculopathy: Principal | ICD-10-CM | POA: Insufficient documentation

## 2017-03-16 DIAGNOSIS — Z823 Family history of stroke: Secondary | ICD-10-CM | POA: Diagnosis not present

## 2017-03-16 DIAGNOSIS — R2681 Unsteadiness on feet: Secondary | ICD-10-CM | POA: Insufficient documentation

## 2017-03-16 DIAGNOSIS — Z8249 Family history of ischemic heart disease and other diseases of the circulatory system: Secondary | ICD-10-CM | POA: Diagnosis not present

## 2017-03-16 DIAGNOSIS — Z419 Encounter for procedure for purposes other than remedying health state, unspecified: Secondary | ICD-10-CM

## 2017-03-16 HISTORY — PX: ANTERIOR CERVICAL DECOMP/DISCECTOMY FUSION: SHX1161

## 2017-03-16 SURGERY — ANTERIOR CERVICAL DECOMPRESSION/DISCECTOMY FUSION 1 LEVEL
Anesthesia: General | Site: Neck

## 2017-03-16 MED ORDER — ACETAMINOPHEN 650 MG RE SUPP: 650.0000 mg | RECTAL | Status: DC | PRN

## 2017-03-16 MED ORDER — OXYCODONE HCL 5 MG PO TABS
10.0000 mg | ORAL_TABLET | ORAL | Status: DC | PRN
  Administered 2017-03-16: 10 mg via ORAL
  Filled 2017-03-16: qty 2

## 2017-03-16 MED ORDER — CEFAZOLIN SODIUM-DEXTROSE 2-4 GM/100ML-% IV SOLN
2.0000 g | Freq: Three times a day (TID) | INTRAVENOUS | Status: AC
  Administered 2017-03-16 – 2017-03-17 (×2): 2 g via INTRAVENOUS
  Filled 2017-03-16 (×2): qty 100

## 2017-03-16 MED ORDER — LACTATED RINGERS IV SOLN: INTRAVENOUS | Status: DC

## 2017-03-16 MED ORDER — OXYCODONE HCL 5 MG PO TABS
5.0000 mg | ORAL_TABLET | ORAL | Status: DC | PRN
  Administered 2017-03-16 – 2017-03-17 (×3): 5 mg via ORAL
  Filled 2017-03-16 (×2): qty 1

## 2017-03-16 MED ORDER — SCOPOLAMINE 1 MG/3DAYS TD PT72
MEDICATED_PATCH | TRANSDERMAL | Status: AC
  Filled 2017-03-16: qty 1

## 2017-03-16 MED ORDER — LORAZEPAM 0.5 MG PO TABS
0.5000 mg | ORAL_TABLET | Freq: Every day | ORAL | Status: DC
  Administered 2017-03-17: 0.5 mg via ORAL
  Filled 2017-03-16: qty 1

## 2017-03-16 MED ORDER — DOCUSATE SODIUM 100 MG PO CAPS
100.0000 mg | ORAL_CAPSULE | Freq: Two times a day (BID) | ORAL | Status: DC
  Administered 2017-03-16 – 2017-03-17 (×2): 100 mg via ORAL
  Filled 2017-03-16 (×2): qty 1

## 2017-03-16 MED ORDER — FENTANYL CITRATE (PF) 100 MCG/2ML IJ SOLN
INTRAMUSCULAR | Status: AC
  Filled 2017-03-16: qty 2

## 2017-03-16 MED ORDER — SODIUM CHLORIDE 0.9% FLUSH: 3.0000 mL | INTRAVENOUS | Status: DC | PRN

## 2017-03-16 MED ORDER — MAGNESIUM CITRATE PO SOLN: 1.0000 | Freq: Once | ORAL | Status: DC | PRN

## 2017-03-16 MED ORDER — MIDAZOLAM HCL 2 MG/2ML IJ SOLN
INTRAMUSCULAR | Status: AC
  Filled 2017-03-16: qty 2

## 2017-03-16 MED ORDER — SODIUM CHLORIDE 0.9 % IV SOLN: 250.0000 mL | INTRAVENOUS | Status: DC

## 2017-03-16 MED ORDER — ACETAMINOPHEN 325 MG PO TABS: 650.0000 mg | ORAL_TABLET | ORAL | Status: DC | PRN

## 2017-03-16 MED ORDER — CEFAZOLIN SODIUM-DEXTROSE 2-4 GM/100ML-% IV SOLN
INTRAVENOUS | Status: AC
  Filled 2017-03-16: qty 100

## 2017-03-16 MED ORDER — OXYCODONE HCL 5 MG PO TABS
ORAL_TABLET | ORAL | Status: AC
  Filled 2017-03-16: qty 1

## 2017-03-16 MED ORDER — SODIUM CHLORIDE 0.9% FLUSH
3.0000 mL | Freq: Two times a day (BID) | INTRAVENOUS | Status: DC
  Administered 2017-03-16: 3 mL via INTRAVENOUS

## 2017-03-16 MED ORDER — PROPOFOL 10 MG/ML IV BOLUS
INTRAVENOUS | Status: AC
  Filled 2017-03-16: qty 20

## 2017-03-16 MED ORDER — SURGIFOAM 100 EX MISC
CUTANEOUS | Status: DC | PRN
  Administered 2017-03-16: 17:00:00 via TOPICAL

## 2017-03-16 MED ORDER — OXYCODONE-ACETAMINOPHEN 10-325 MG PO TABS: 1.0000 | ORAL_TABLET | Freq: Four times a day (QID) | ORAL | 0 refills | Status: DC | PRN

## 2017-03-16 MED ORDER — METHOCARBAMOL 1000 MG/10ML IJ SOLN
500.0000 mg | Freq: Four times a day (QID) | INTRAVENOUS | Status: DC | PRN
  Filled 2017-03-16: qty 5

## 2017-03-16 MED ORDER — ONDANSETRON HCL 4 MG/2ML IJ SOLN
INTRAMUSCULAR | Status: DC | PRN
  Administered 2017-03-16: 4 mg via INTRAVENOUS

## 2017-03-16 MED ORDER — FENTANYL CITRATE (PF) 100 MCG/2ML IJ SOLN
25.0000 ug | INTRAMUSCULAR | Status: DC | PRN
  Administered 2017-03-16: 50 ug via INTRAVENOUS
  Administered 2017-03-16 (×2): 25 ug via INTRAVENOUS

## 2017-03-16 MED ORDER — SCOPOLAMINE 1 MG/3DAYS TD PT72
1.0000 | MEDICATED_PATCH | Freq: Once | TRANSDERMAL | Status: DC
  Administered 2017-03-16: 1.5 mg via TRANSDERMAL

## 2017-03-16 MED ORDER — MENTHOL 3 MG MT LOZG: 1.0000 | LOZENGE | OROMUCOSAL | Status: DC | PRN

## 2017-03-16 MED ORDER — METHOCARBAMOL 500 MG PO TABS: 500.0000 mg | ORAL_TABLET | Freq: Three times a day (TID) | ORAL | 0 refills | Status: DC

## 2017-03-16 MED ORDER — CEFAZOLIN SODIUM-DEXTROSE 2-4 GM/100ML-% IV SOLN
2.0000 g | INTRAVENOUS | Status: AC
  Administered 2017-03-16: 2 g via INTRAVENOUS

## 2017-03-16 MED ORDER — SUGAMMADEX SODIUM 200 MG/2ML IV SOLN
INTRAVENOUS | Status: DC | PRN
  Administered 2017-03-16: 200 mg via INTRAVENOUS

## 2017-03-16 MED ORDER — PROPOFOL 10 MG/ML IV BOLUS
INTRAVENOUS | Status: DC | PRN
  Administered 2017-03-16: 120 mg via INTRAVENOUS

## 2017-03-16 MED ORDER — LACTATED RINGERS IV SOLN
INTRAVENOUS | Status: DC | PRN
  Administered 2017-03-16: 16:00:00 via INTRAVENOUS

## 2017-03-16 MED ORDER — HEMOSTATIC AGENTS (NO CHARGE) OPTIME
TOPICAL | Status: DC | PRN
  Administered 2017-03-16: 1 via TOPICAL

## 2017-03-16 MED ORDER — BUPIVACAINE-EPINEPHRINE 0.25% -1:200000 IJ SOLN
INTRAMUSCULAR | Status: DC | PRN
  Administered 2017-03-16: 10 mL

## 2017-03-16 MED ORDER — FENTANYL CITRATE (PF) 250 MCG/5ML IJ SOLN
INTRAMUSCULAR | Status: AC
  Filled 2017-03-16: qty 5

## 2017-03-16 MED ORDER — ONDANSETRON HCL 4 MG PO TABS: 4.0000 mg | ORAL_TABLET | Freq: Four times a day (QID) | ORAL | Status: DC | PRN

## 2017-03-16 MED ORDER — LACTATED RINGERS IV SOLN
INTRAVENOUS | Status: DC
  Administered 2017-03-16: 13:00:00 via INTRAVENOUS

## 2017-03-16 MED ORDER — FENTANYL CITRATE (PF) 250 MCG/5ML IJ SOLN
INTRAMUSCULAR | Status: DC | PRN
  Administered 2017-03-16: 50 ug via INTRAVENOUS
  Administered 2017-03-16: 100 ug via INTRAVENOUS

## 2017-03-16 MED ORDER — DEXAMETHASONE SODIUM PHOSPHATE 10 MG/ML IJ SOLN
INTRAMUSCULAR | Status: DC | PRN
  Administered 2017-03-16: 10 mg via INTRAVENOUS

## 2017-03-16 MED ORDER — ONDANSETRON HCL 4 MG/2ML IJ SOLN: 4.0000 mg | Freq: Four times a day (QID) | INTRAMUSCULAR | Status: DC | PRN

## 2017-03-16 MED ORDER — MIDAZOLAM HCL 2 MG/2ML IJ SOLN
INTRAMUSCULAR | Status: DC | PRN
  Administered 2017-03-16: 2 mg via INTRAVENOUS

## 2017-03-16 MED ORDER — LACTATED RINGERS IV SOLN
INTRAVENOUS | Status: DC | PRN
  Administered 2017-03-16: 15:00:00 via INTRAVENOUS

## 2017-03-16 MED ORDER — POLYETHYLENE GLYCOL 3350 17 G PO PACK: 17.0000 g | PACK | Freq: Every day | ORAL | Status: DC | PRN

## 2017-03-16 MED ORDER — THROMBIN 20000 UNITS EX SOLR
CUTANEOUS | Status: AC
  Filled 2017-03-16: qty 20000

## 2017-03-16 MED ORDER — BUPIVACAINE HCL (PF) 0.25 % IJ SOLN
INTRAMUSCULAR | Status: AC
  Filled 2017-03-16: qty 30

## 2017-03-16 MED ORDER — ROCURONIUM BROMIDE 10 MG/ML (PF) SYRINGE
PREFILLED_SYRINGE | INTRAVENOUS | Status: DC | PRN
  Administered 2017-03-16: 20 mg via INTRAVENOUS
  Administered 2017-03-16: 60 mg via INTRAVENOUS

## 2017-03-16 MED ORDER — PHENYLEPHRINE HCL 10 MG/ML IJ SOLN
INTRAMUSCULAR | Status: DC | PRN
  Administered 2017-03-16: 80 ug via INTRAVENOUS

## 2017-03-16 MED ORDER — MORPHINE SULFATE (PF) 4 MG/ML IV SOLN: 1.0000 mg | INTRAVENOUS | Status: DC | PRN

## 2017-03-16 MED ORDER — PHENOL 1.4 % MT LIQD
1.0000 | OROMUCOSAL | Status: DC | PRN
  Administered 2017-03-17: 1 via OROMUCOSAL
  Filled 2017-03-16: qty 177

## 2017-03-16 MED ORDER — ONDANSETRON HCL 4 MG/2ML IJ SOLN: 4.0000 mg | Freq: Once | INTRAMUSCULAR | Status: DC | PRN

## 2017-03-16 MED ORDER — ONDANSETRON 4 MG PO TBDP: 4.0000 mg | ORAL_TABLET | Freq: Three times a day (TID) | ORAL | 0 refills | Status: DC | PRN

## 2017-03-16 MED ORDER — LIDOCAINE 2% (20 MG/ML) 5 ML SYRINGE
INTRAMUSCULAR | Status: DC | PRN
  Administered 2017-03-16: 60 mg via INTRAVENOUS

## 2017-03-16 MED ORDER — METHOCARBAMOL 500 MG PO TABS
500.0000 mg | ORAL_TABLET | Freq: Four times a day (QID) | ORAL | Status: DC | PRN
  Administered 2017-03-16 – 2017-03-17 (×2): 500 mg via ORAL
  Filled 2017-03-16 (×2): qty 1

## 2017-03-16 MED ORDER — 0.9 % SODIUM CHLORIDE (POUR BTL) OPTIME
TOPICAL | Status: DC | PRN
  Administered 2017-03-16: 1000 mL

## 2017-03-16 MED ORDER — DULOXETINE HCL 30 MG PO CPEP
60.0000 mg | ORAL_CAPSULE | Freq: Every day | ORAL | Status: DC
  Administered 2017-03-17: 60 mg via ORAL
  Filled 2017-03-16: qty 2

## 2017-03-16 SURGICAL SUPPLY — 64 items
BLADE CLIPPER SURG (BLADE) IMPLANT
BONE VIVIGEN FORMABLE 1.3CC (Bone Implant) ×3 IMPLANT
CABLE BIPOLOR RESECTION CORD (MISCELLANEOUS) ×3 IMPLANT
CAGE SPNL 6D 12XSM 14X7X (Cage) IMPLANT
CANISTER SUCT 3000ML PPV (MISCELLANEOUS) ×3 IMPLANT
CLOSURE STERI-STRIP 1/2X4 (GAUZE/BANDAGES/DRESSINGS) ×1
CLOSURE WOUND 1/2 X4 (GAUZE/BANDAGES/DRESSINGS) ×1
CLSR STERI-STRIP ANTIMIC 1/2X4 (GAUZE/BANDAGES/DRESSINGS) ×2 IMPLANT
COVER MAYO STAND STRL (DRAPES) ×9 IMPLANT
COVER SURGICAL LIGHT HANDLE (MISCELLANEOUS) ×2 IMPLANT
CRADLE DONUT ADULT HEAD (MISCELLANEOUS) ×3 IMPLANT
DRAPE C-ARM 42X72 X-RAY (DRAPES) ×3 IMPLANT
DRAPE MICROSCOPE LEICA 46X105 (MISCELLANEOUS) ×3 IMPLANT
DRAPE POUCH INSTRU U-SHP 10X18 (DRAPES) ×3 IMPLANT
DRAPE SURG 17X23 STRL (DRAPES) ×3 IMPLANT
DRAPE U-SHAPE 47X51 STRL (DRAPES) ×3 IMPLANT
DRSG OPSITE POSTOP 3X4 (GAUZE/BANDAGES/DRESSINGS) ×3 IMPLANT
DURAPREP 26ML APPLICATOR (WOUND CARE) ×3 IMPLANT
ELECT COATED BLADE 2.86 ST (ELECTRODE) ×3 IMPLANT
ELECT PENCIL ROCKER SW 15FT (MISCELLANEOUS) ×3 IMPLANT
ELECT REM PT RETURN 9FT ADLT (ELECTROSURGICAL) ×3
ELECTRODE REM PT RTRN 9FT ADLT (ELECTROSURGICAL) ×1 IMPLANT
FLOSEAL 10ML (HEMOSTASIS) ×2 IMPLANT
FUSION TCS NANOLOCK 7MM 6DEG (Cage) ×3 IMPLANT
GLOVE BIO SURGEON STRL SZ 6.5 (GLOVE) ×2 IMPLANT
GLOVE BIO SURGEONS STRL SZ 6.5 (GLOVE) ×1
GLOVE BIOGEL PI IND STRL 6.5 (GLOVE) ×1 IMPLANT
GLOVE BIOGEL PI IND STRL 8.5 (GLOVE) ×1 IMPLANT
GLOVE BIOGEL PI INDICATOR 6.5 (GLOVE) ×2
GLOVE BIOGEL PI INDICATOR 8.5 (GLOVE) ×2
GLOVE SS BIOGEL STRL SZ 8.5 (GLOVE) ×1 IMPLANT
GLOVE SUPERSENSE BIOGEL SZ 8.5 (GLOVE) ×2
GOWN STRL REUS W/ TWL LRG LVL3 (GOWN DISPOSABLE) ×1 IMPLANT
GOWN STRL REUS W/TWL 2XL LVL3 (GOWN DISPOSABLE) ×6 IMPLANT
GOWN STRL REUS W/TWL LRG LVL3 (GOWN DISPOSABLE) ×3
GRAFT BNE MATRIX VG FRMBL SM 1 (Bone Implant) IMPLANT
KIT BASIN OR (CUSTOM PROCEDURE TRAY) ×3 IMPLANT
KIT ROOM TURNOVER OR (KITS) ×3 IMPLANT
NDL SPNL 18GX3.5 QUINCKE PK (NEEDLE) ×1 IMPLANT
NEEDLE HYPO 22GX1.5 SAFETY (NEEDLE) ×3 IMPLANT
NEEDLE SPNL 18GX3.5 QUINCKE PK (NEEDLE) ×3 IMPLANT
NS IRRIG 1000ML POUR BTL (IV SOLUTION) ×3 IMPLANT
PACK ORTHO CERVICAL (CUSTOM PROCEDURE TRAY) ×3 IMPLANT
PACK UNIVERSAL I (CUSTOM PROCEDURE TRAY) ×3 IMPLANT
PAD ARMBOARD 7.5X6 YLW CONV (MISCELLANEOUS) ×6 IMPLANT
PATTIES SURGICAL .25X.25 (GAUZE/BANDAGES/DRESSINGS) IMPLANT
PIN DISTRACTION 14 (PIN) ×4 IMPLANT
RESTRAINT LIMB HOLDER UNIV (RESTRAINTS) ×3 IMPLANT
RUBBERBAND STERILE (MISCELLANEOUS) ×2 IMPLANT
SCREW ENDO BONE 3.8X14MM (Screw) ×4 IMPLANT
SPONGE INTESTINAL PEANUT (DISPOSABLE) ×7 IMPLANT
SPONGE SURGIFOAM ABS GEL 100 (HEMOSTASIS) ×3 IMPLANT
STRIP CLOSURE SKIN 1/2X4 (GAUZE/BANDAGES/DRESSINGS) ×1 IMPLANT
SUT BONE WAX W31G (SUTURE) ×3 IMPLANT
SUT MON AB 3-0 SH 27 (SUTURE) ×3
SUT MON AB 3-0 SH27 (SUTURE) ×1 IMPLANT
SUT VIC AB 2-0 CT1 18 (SUTURE) ×3 IMPLANT
SYR BULB IRRIGATION 50ML (SYRINGE) ×3 IMPLANT
SYR CONTROL 10ML LL (SYRINGE) ×5 IMPLANT
TAPE CLOTH 4X10 WHT NS (GAUZE/BANDAGES/DRESSINGS) ×3 IMPLANT
TAPE UMBILICAL COTTON 1/8X30 (MISCELLANEOUS) ×3 IMPLANT
TOWEL GREEN STERILE (TOWEL DISPOSABLE) ×3 IMPLANT
TOWEL GREEN STERILE FF (TOWEL DISPOSABLE) ×3 IMPLANT
WATER STERILE IRR 1000ML POUR (IV SOLUTION) ×3 IMPLANT

## 2017-03-16 NOTE — Discharge Instructions (Signed)
Cervical Fusion, Care After °This sheet gives you information about how to care for yourself after your procedure. Your health care provider may also give you more specific instructions. If you have problems or questions, contact your health care provider. °What can I expect after the procedure? °After the procedure, it is common to have: °· Incision area pain. °· Numbness. °· Weakness. °· Sore throat. °· Difficulty swallowing. ° °Follow these instructions at home: °Medicines °· Take over-the-counter and prescription medicines, including pain medicines, only as told by your health care provider. °· If you were prescribed an antibiotic medicine, take it as told by your health care provider. Do not stop taking the antibiotic even if you start to feel better. °If you have a brace: °· Wear the brace as told by your health care provider. Remove it only as told by your health care provider. °· Keep the brace clean. °Incision care °· Follow instructions from your health care provider about how to take care of your incision. Make sure you: °? Wash your hands with soap and water before you change your bandage (dressing). If soap and water are not available, use hand sanitizer. °? Change your dressing as told by your health care provider. °? Leave stitches (sutures), skin glue, or adhesive strips in place. These skin closures may need to be in place for 2 weeks or longer. If adhesive strip edges start to loosen and curl up, you may trim the loose edges. Do not remove adhesive strips completely unless your health care provider tells you to do that. °· Keep your incision clean and dry. Do not take baths, swim, or use a hot tub until your health care provider approves. °· Check your incision area every day for signs of infection. Check for: °? More redness, swelling, or pain. °? More fluid or blood. °? Warmth. °? Pus or a bad smell. °Activity ° °· Rest and protect your back as much as possible. °· Do not lift anything that is  heavier than 10 lb (4.5 kg) or the limit that you are told by your health care provider. °· Do not twist or bend at the waist until your health care provider approves. °· Avoid: °? Pushing and pulling motions. °? Lifting anything over your head. °? Sitting or lying down in the same position for long periods of time. °· Do not exercise until your health care provider approves. Once your health care provider has approved exercise, ask him or her what kinds of exercises you can do to make your back stronger (physical therapy). °· Do not drive until your health care provider approves. °? Do not drive for 24 hours if you received a medicine to help you relax (sedative). °? Do not drive or use heavy machinery while taking prescription pain medicine. °General instructions ° °· Have someone assist you to turn in bed frequently by moving your whole body without twisting your back (log rolling technique). °· Wear compression stockings as told by your health care provider. These stockings help to prevent blood clots and reduce swelling in your legs. °· Do not use any products that contain nicotine or tobacco, such as cigarettes and e-cigarettes. These can delay bone healing. If you need help quitting, ask your health care provider. °· To prevent or treat constipation while you are taking prescription pain medicine, your health care provider may recommend that you: °? Drink enough fluid to keep your urine clear or pale yellow. °? Take over-the-counter or prescription medicines. °? Eat foods   that are high in fiber, such as fresh fruits and vegetables, whole grains, and beans. °? Limit foods that are high in fat and processed sugars, such as fried and sweet foods. °· Keep all follow-up visits as told by your health care provider. This is important. °Contact a health care provider if: °· You have pain that gets worse or does not get better with medicine. °· You have more redness, swelling, or pain around your incision. °· You have  more fluid or blood coming from your incision. °· Your incision feels warm to the touch. °· You have pus or a bad smell coming from your incision. °· You have a fever. °· You have weakness or numbness in your legs that is new or getting worse. °· You have swelling in your calf or leg. °· The edges of your incision break open. °· You vomit or feel nauseous. °· You have trouble controlling urination or bowel movements. °Get help right away if: °· You develop shortness of breath or chest pain. °· You have trouble swallowing. °· You have trouble breathing. °· You develop a cough. °This information is not intended to replace advice given to you by your health care provider. Make sure you discuss any questions you have with your health care provider. °Document Released: 08/04/2003 Document Revised: 07/15/2015 Document Reviewed: 07/01/2015 °[employer]vier Interactive Patient Education © 2018 [employer] ° ° ° ° ° ° °

## 2017-03-16 NOTE — Anesthesia Preprocedure Evaluation (Addendum)
Anesthesia Evaluation  Patient identified by MRN, date of birth, ID band Patient awake    Reviewed: Allergy & Precautions, NPO status , Patient's Chart, lab work & pertinent test results  History of Anesthesia Complications (+) PONV  Airway Mallampati: II  TM Distance: >3 FB Neck ROM: Limited    Dental  (+) Dental Advisory Given   Pulmonary neg pulmonary ROS,    breath sounds clear to auscultation       Cardiovascular negative cardio ROS   Rhythm:Regular Rate:Normal     Neuro/Psych Anxiety Depression negative neurological ROS     GI/Hepatic Neg liver ROS, GERD  ,  Endo/Other  negative endocrine ROS  Renal/GU negative Renal ROS     Musculoskeletal  (+) Arthritis ,   Abdominal   Peds  Hematology negative hematology ROS (+)   Anesthesia Other Findings   Reproductive/Obstetrics                            Lab Results  Component Value Date   WBC 5.5 03/08/2017   HGB 12.9 03/08/2017   HCT 39.4 03/08/2017   MCV 89.7 03/08/2017   PLT 244 03/08/2017   Lab Results  Component Value Date   CREATININE 1.10 08/17/2015   BUN 11 08/17/2015   NA 143 08/17/2015   K 4.7 08/17/2015   CL 105 08/17/2015   CO2 30 08/17/2015    Anesthesia Physical Anesthesia Plan  ASA: II  Anesthesia Plan: General   Post-op Pain Management:    Induction: Intravenous  PONV Risk Score and Plan: 4 or greater and Midazolam, Dexamethasone, Ondansetron, Treatment may vary due to age or medical condition and Scopolamine patch - Pre-op  Airway Management Planned: Oral ETT and Video Laryngoscope Planned  Additional Equipment:   Intra-op Plan:   Post-operative Plan: Extubation in OR  Informed Consent: I have reviewed the patients History and Physical, chart, labs and discussed the procedure including the risks, benefits and alternatives for the proposed anesthesia with the patient or authorized representative  who has indicated his/her understanding and acceptance.   Dental advisory given  Plan Discussed with: CRNA  Anesthesia Plan Comments:        Anesthesia Quick Evaluation

## 2017-03-16 NOTE — Brief Op Note (Signed)
03/16/2017  6:15 PM  PATIENT:  Lindsay Parks  62 y.o. female  PRE-OPERATIVE DIAGNOSIS:  Radiculopathy, cervical region  POST-OPERATIVE DIAGNOSIS:  Radiculopathy, cervical region  PROCEDURE:  Procedure(s): ANTERIOR CERVICAL DISCECTOMY AND FUSION FOR DECOMPRESSION ONE  LEVEL CERVICAL SIX-SEVEN  RIGHT SIDED APPROACH (N/A)  SURGEON:  Surgeon(s) and Role:    Venita Lick* Corina Stacy, MD - Primary  PHYSICIAN ASSISTANT:   ASSISTANTS: carmen mayo   ANESTHESIA:   general  EBL:  minimal   BLOOD ADMINISTERED:none  DRAINS: none   LOCAL MEDICATIONS USED:  MARCAINE     SPECIMEN:  No Specimen  DISPOSITION OF SPECIMEN:  N/A  COUNTS:  YES  TOURNIQUET:  * No tourniquets in log *  DICTATION: .Dragon Dictation  PLAN OF CARE: Admit for overnight observation  PATIENT DISPOSITION:  PACU - hemodynamically stable.

## 2017-03-16 NOTE — Op Note (Signed)
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  Previous C4-6 ACDF.  Postoperative diagnosis: Same  Operative procedure: Anterior cervical discectomy and fusion C6-7.  First assistant Beech[place]armen Mayo, [place]  Implant system: Titan Nano lock 0 profile size 7 small lordotic intervertebral cage.  3.8 mm locking screws x2.  Allograft: vivogen  Indications: This is a very pleasant 62 year old woman who had a previous 2 level ACDF several years ago.  She has done well until recently when she started having significant neck pain with neuropathic right arm pain.  Attempts at conservative management failed to control her pain and improve her quality of life.  As a result she elected to move forward with surgery.  All appropriate risks benefits and alternatives were discussed with the patient and consent was obtained.  Operative report: Patient was brought the operating room placed upon the operating room table.  After successful induction of general anesthesia and endotracheally patient teds SCDs were applied and the anterior cervical spine was prepped and draped in a standard fashion.  Timeout was taken to confirm patient procedure and all other important data.  Once this was completed x-ray was used to identify the C6-7 level.  Once confirmed I marked out an incision starting from midline and proceeding to the right I infiltrated this with quarter percent Marcaine with epinephrine and then made a transverse incision centered over the C6-7 disc space  Sharp dissection was carried out down to the deep fascia.  The platysma was isolated and divided.  I then identified the large external jugular which was protected and I began dissecting along the medial border the sternocleidomastoid I dissected down and identified the omohyoid and released it from its sling.  I continued dissecting bluntly down into the deep cervical fascia.  I swept the esophagus and trachea over to  the left and then it identified the carotid sheath in the right side.  This was protected with a finger.  A hand-held retractor was used to hold the esophagus and tracheoff to the left.  I completed my anterior dissection using Kitner dissectors.  I exposed the inferior portion of the cervical plate as well as the C6-7 disc space.  Anterior exostosis was removed from the C6-7 disc space and distraction pins were placed into the body of C6 and C7.  I an annulotomy was then performed with a 15 blade scalpel and then I began removing the disc material with pituitary rongeur.  There was significant collapse and degeneration of this disc space.  I removed the overhanging osteophyte with a 1 and 2 mm Kerrison punch.  I placed a interlaminar spreader and distracted the space and then maintain that with the distraction pin set.  I continue to use neuro curettes to dissect posteriorly removing disc material.  I also remove the cartilaginous endplate.  I continue to work all the way posteriorly until I could visualize posterior annulus.  I used a fine nerve hook to develop a plane underneath the annulus and in the posterior rim of the vertebral body and used a 1 mm Kerrison rongeur to resect the osteophyte from the posterior aspect of the vertebral bodies.  At this point had an excellent discectomy decompression.  I was able to undercut the uncovertebral joint to adequately decompress the nerve root.  I then trialed and elected to use the size 7 small Nano lock lordotic cage  This provided excellent sedation and was quite stable.  It also restored the disc height  and provided indirect foraminal decompression.  I then obtained the actual implant impacted with the allograft and then malleted to the appropriate depth.  I then secured it with  3.8 diameter screws 14 mm length.  Both screws had excellent purchase.  At this point the distraction pins were removed and the resulting holes were patched with bone wax.  I irrigated  the wound copiously normal saline and then used bipolar cautery and Surgi-Flo to ensure that there was hemostasis.  At this point I then return the tracheoesophagus to midline and closed the platysma with interrupted 2-0 Vicryl suture superficially with 3-0 Vicryl.  Steri-Strips dry dressing were applied the patient was extubated transferred the PACU without incident.  End the case all needle sponge counts were correct.

## 2017-03-16 NOTE — Transfer of Care (Signed)
Immediate Anesthesia Transfer of Care Note  Patient: Lindsay Parks  Procedure(s) Performed: ANTERIOR CERVICAL DISCECTOMY AND FUSION FOR DECOMPRESSION ONE  LEVEL CERVICAL SIX-SEVEN  RIGHT SIDED APPROACH (N/A Neck)  Patient Location: PACU  Anesthesia Type:General  Level of Consciousness: awake  Airway & Oxygen Therapy: Patient Spontanous Breathing  Post-op Assessment: Report given to RN and Post -op Vital signs reviewed and stable  Post vital signs: Reviewed and stable  Last Vitals:  Vitals:   03/16/17 1242  BP: (!) 124/53  Pulse: 76  Resp: 20  Temp: 36.5 C  SpO2: 100%    Last Pain:  Vitals:   03/16/17 1242  TempSrc: Oral  PainSc:          Complications: No apparent anesthesia complications

## 2017-03-16 NOTE — Anesthesia Postprocedure Evaluation (Signed)
Anesthesia Post Note  Patient: Lindsay Parks  Procedure(s) Performed: ANTERIOR CERVICAL DISCECTOMY AND FUSION FOR DECOMPRESSION ONE  LEVEL CERVICAL SIX-SEVEN  RIGHT SIDED APPROACH (N/A Neck)     Patient location during evaluation: PACU Anesthesia Type: General Level of consciousness: awake and alert Pain management: pain level controlled Vital Signs Assessment: post-procedure vital signs reviewed and stable Respiratory status: spontaneous breathing, nonlabored ventilation and respiratory function stable Cardiovascular status: blood pressure returned to baseline and stable Postop Assessment: no apparent nausea or vomiting Anesthetic complications: no    Last Vitals:  Vitals:   03/16/17 1915 03/16/17 1930  BP: (!) 153/79 (!) 148/73  Pulse: 89 84  Resp: 17 19  Temp:  (!) 36.2 C  SpO2: 99% 98%    Last Pain:  Vitals:   03/16/17 1845  TempSrc:   PainSc: 10-Worst pain ever                 Shiron Whetsel,W. EDMOND

## 2017-03-16 NOTE — Anesthesia Procedure Notes (Signed)
Procedure Name: Intubation Date/Time: 03/16/2017 3:53 PM Performed by: Bryson Corona, CRNA Pre-anesthesia Checklist: Patient identified, Emergency Drugs available, Suction available and Patient being monitored Patient Re-evaluated:Patient Re-evaluated prior to induction Oxygen Delivery Method: Circle System Utilized Preoxygenation: Pre-oxygenation with 100% oxygen Induction Type: IV induction Ventilation: Mask ventilation without difficulty Laryngoscope Size: Mac and 3 Grade View: Grade II Tube type: Oral Tube size: 7.0 mm Number of attempts: 1 Airway Equipment and Method: Stylet Placement Confirmation: ETT inserted through vocal cords under direct vision,  positive ETCO2 and breath sounds checked- equal and bilateral Secured at: 21 cm Tube secured with: Tape Dental Injury: Teeth and Oropharynx as per pre-operative assessment

## 2017-03-17 ENCOUNTER — Encounter (HOSPITAL_COMMUNITY): Payer: Self-pay | Admitting: Orthopedic Surgery

## 2017-03-17 DIAGNOSIS — M50123 Cervical disc disorder at C6-C7 level with radiculopathy: Secondary | ICD-10-CM | POA: Diagnosis not present

## 2017-03-17 MED FILL — Thrombin For Soln 20000 Unit: CUTANEOUS | Qty: 1 | Status: AC

## 2017-03-17 NOTE — Progress Notes (Signed)
Patient alert and oriented, mae's well, voiding adequate amount of urine, swallowing without difficulty, no c/o pain at time of discharge. Patient discharged home with family. Script and discharged instructions given to patient. Patient and family stated understanding of instructions given. Patient has an appointment with Dr. Brooks  

## 2017-03-17 NOTE — Evaluation (Signed)
Occupational Therapy Evaluation Patient Details Name: Lindsay ByarsHazel G Parks MRN: [id number] DOB: Apr 13, 1955 Today's Date: 03/17/2017    History of Present Illness Pt is a 62 y/o female who presents s/p C6-C7 ACDF on 03/16/17. 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    Clinical Impression   Patient evaluated by Occupational Therapy with no further acute OT needs identified. All education has been completed and the patient has no further questions. See below for any follow-up Occupational Therapy or equipment needs. OT to sign off. Thank you for referral.      Follow Up Recommendations  No OT follow up    Equipment Recommendations  None recommended by OT    Recommendations for Other Services       Precautions / Restrictions Precautions Precautions: Fall;Cervical Precaution Booklet Issued: Yes (comment) Precaution Comments: handout reviewed in detail with patient for adls Required Braces or Orthoses: Cervical Brace Cervical Brace: Hard collar Restrictions Weight Bearing Restrictions: No      Mobility Bed Mobility Overal bed mobility: Needs Assistance Bed Mobility: Rolling;Sidelying to Sit;Sit to Sidelying Rolling: Modified independent (Device/Increase time) Sidelying to sit: Supervision     Sit to sidelying: Supervision General bed mobility comments: VC's for proper log roll technique. HOB flat and rails lowered to simulate home environment.   Transfers Overall transfer level: Needs assistance Equipment used: None Transfers: Sit to/from Stand Sit to Stand: Supervision;Modified independent (Device/Increase time)         General transfer comment: observed in the hall with PT prior to session start    Balance Overall balance assessment: Mild deficits observed, not formally tested Sitting-balance support: Feet supported;No upper extremity supported Sitting  balance-Leahy Scale: Fair     Standing balance support: No upper extremity supported;During functional activity Standing balance-Leahy Scale: Fair                             ADL either performed or assessed with clinical judgement   ADL Overall ADL's : Needs assistance/impaired Eating/Feeding: Set up   Grooming: Set up   Upper Body Bathing: Set up   Lower Body Bathing: Min guard                         General ADL Comments: pt able to demonstrate and verbalize don doff brace. pt pleasant and very cooperative. pt educated on UE exercises for fine motor.   Cervical precautions Educated patient on don doff brace with return demonstration (change of pads), educated on oral care using cups, washing face with cloth, never to wash directly on incision site, avoid neck rotation flexion and extension, positioning with pillows in chair for bil UE, sleeping positioning, avoiding pushing / pulling with bil UE, and fine motor exercisesPt educated on need to notify doctor / RN of swallowing changes or choking..     Vision Baseline Vision/History: No visual deficits       Perception     Praxis      Pertinent Vitals/Pain Pain Assessment: Faces Faces Pain Scale: Hurts little more Pain Location: Neck Pain Descriptors / Indicators: Operative site guarding Pain Intervention(s): Monitored during session;Premedicated before session     Hand Dominance Right   Extremity/Trunk Assessment Upper Extremity Assessment Upper Extremity Assessment: Defer to OT evaluation   Lower Extremity Assessment Lower Extremity Assessment: RLE deficits/detail;LLE deficits/detail RLE Deficits / Details: reports numbness in just the first  3 finger tips LLE Deficits / Details: noraml   Cervical / Trunk Assessment Cervical / Trunk Assessment: Other exceptions Cervical / Trunk Exceptions: s/p surg with hx of previous surg   Communication Communication Communication: No difficulties    Cognition Arousal/Alertness: Awake/alert Behavior During Therapy: WFL for tasks assessed/performed Overall Cognitive Status: Within Functional Limits for tasks assessed                                     General Comments  dressing dry and intact educated on bathing/ dressing with wound    Exercises Exercises: Hand exercises;Hand activities   Shoulder Instructions      Home Living Family/patient expects to be discharged to:: Private residence Living Arrangements: Spouse/significant other Available Help at Discharge: Family;Personal care attendant;Available 24 hours/day Type of Home: House Home Access: Stairs to enter [employer] of Steps: 6 Entrance Stairs-Rails: Right;Left;Can reach both Home Layout: Two level;Able to live on main level with bedroom/bathroom     Bathroom Shower/Tub: Producer, television/film/video: Handicapped height Bathroom Accessibility: Yes   Home Equipment: Environmental consultant - 2 wheels;Cane - single point;Bedside commode;Shower seat - built in;Wheelchair - manual   Additional Comments: will have (A) of daugher and son- the spouse has parkinsons      Prior Functioning/Environment Level of Independence: Independent        Comments: Pt reports falls in the past. 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 She has an aide for him to assist with physical care - will be available 24 hours until monday (3 days) to assist with both pt and husband.         OT Problem List:        OT Treatment/Interventions:      OT Goals(Current goals can be found in the care plan section) Acute Rehab OT Goals Patient Stated Goal: Home today OT Goal Formulation: With patient  OT Frequency:     Barriers to D/C:            Co-evaluation              AM-PAC PT "6 Clicks" Daily Activity     Outcome Measure Help from another person eating meals?: A Little Help from another person taking care of personal grooming?: A Little Help from  another person toileting, which includes using toliet, bedpan, or urinal?: A Little Help from another person bathing (including washing, rinsing, drying)?: A Little Help from another person to put on and taking off regular upper body clothing?: A Little Help from another person to put on and taking off regular lower body clothing?: A Little 6 Click Score: 18   End of Session Equipment Utilized During Treatment: Cervical collar Nurse Communication: Mobility status;Precautions  Activity Tolerance: Patient tolerated treatment well Patient left: in bed;with call bell/phone within reach  OT Visit Diagnosis: Unsteadiness on feet (R26.81)                Time: 7829-5621 OT Time Calculation (min): 16 min Charges:  OT General Charges $OT Visit: 1 Visit OT Evaluation $OT Eval Moderate Complexity: 1 Mod G-Codes:      Mateo Flow   OTR/L Pager: [phone] Office: [phone] .   Boone Master B 03/17/2017, 12:09 PM

## 2017-03-17 NOTE — Progress Notes (Signed)
    Subjective: Procedure(s) (LRB): ANTERIOR CERVICAL DISCECTOMY AND FUSION FOR DECOMPRESSION ONE  LEVEL CERVICAL SIX-SEVEN  RIGHT SIDED APPROACH (N/A) 1 Day Post-Op  Patient reports pain as 1 on 0-10 scale.  Reports decreased arm pain reports incisional neck pain   Positive void Positive bowel movement Positive flatus Negative chest pain or shortness of breath  Objective: Vital signs in last 24 hours: Temp:  [97.2 F (36.2 C)-98.4 F (36.9 C)] 97.9 F (36.6 C) (03/15 0754) Pulse Rate:  [75-92] 89 (03/15 0754) Resp:  [12-20] 16 (03/15 0754) BP: (116-153)/(53-86) 117/65 (03/15 0754) SpO2:  [94 %-100 %] 100 % (03/15 0754) Weight:  [94.8 kg (209 lb)-95.1 kg (209 lb 9 oz)] 94.8 kg (209 lb) (03/14 1242)  Intake/Output from previous day: 03/14 0701 - 03/15 0700 In: 1200 [I.V.:1200] Out: 25 [Blood:25]  Labs: No results for input(s): WBC, RBC, HCT, PLT in the last 72 hours. No results for input(s): NA, K, CL, CO2, BUN, CREATININE, GLUCOSE, CALCIUM in the last 72 hours. No results for input(s): LABPT, INR in the last 72 hours.  Physical Exam: Neurologically intact ABD soft Neurovascular intact Incision: dressing C/D/I Compartment soft Body mass index is 33.73 kg/m.  Assessment/Plan: Patient stable  xrays n/a Mobilization with physical therapy Encourage incentive spirometry Continue care  Advance diet Up with therapyDoing well overall - radicular arm pain improved.  No swelling, dysphagia, or dysphonia at present [facility]  F/u in 2 weeks  Venita Lickahari Delane Wessinger, MD [facility] [phone]

## 2017-03-17 NOTE — Evaluation (Signed)
Physical Therapy Evaluation Patient Details Name: Lindsay Parks MRN: [id number] DOB: 02-Dec-1955 Today's Date: 03/17/2017   History of Present Illness  Pt is a 62 y/o female who presents s/p C6-C7 ACDF on 03/16/17. 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   Clinical Impression  Patient evaluated by Physical Therapy with no further acute PT needs identified. All education has been completed and the patient has no further questions. At the time of PT eval pt was able to perform transfers and ambulation with gross supervision for safety progressing to modified independence by end of session. Pt was educated on car transfer, precautions, brace application, and general safety with activity progression. See below for any follow-up Physical Therapy or equipment needs. PT is signing off. Thank you for this referral.        Follow Up Recommendations No PT follow up;Supervision for mobility/OOB    Equipment Recommendations  None recommended by PT    Recommendations for Other Services       Precautions / Restrictions Precautions Precautions: Fall;Cervical Precaution Booklet Issued: Yes (comment) Precaution Comments: handout reviewed in detail with patient for adls Required Braces or Orthoses: Cervical Brace Cervical Brace: Hard collar Restrictions Weight Bearing Restrictions: No      Mobility  Bed Mobility Overal bed mobility: Needs Assistance Bed Mobility: Rolling;Sidelying to Sit;Sit to Sidelying Rolling: Modified independent (Device/Increase time) Sidelying to sit: Supervision     Sit to sidelying: Supervision General bed mobility comments: VC's for proper log roll technique. HOB flat and rails lowered to simulate home environment.   Transfers Overall transfer level: Needs assistance Equipment used: None Transfers: Sit to/from Stand Sit to Stand: Supervision;Modified  independent (Device/Increase time)         General transfer comment: Light supervision initially progressing to mod I by end of session. No unsteadiness or LOB noted.   Ambulation/Gait Ambulation/Gait assistance: Supervision;Modified independent (Device/Increase time) Ambulation Distance (Feet): 250 Feet Assistive device: None Gait Pattern/deviations: Step-through pattern;Decreased stride length Gait velocity: Decreased Gait velocity interpretation: Below normal speed for age/gender General Gait Details: Slow and guarded with UE's out for balance initially. As gait training progressed pt was able to ambulate with modified independence. No overt LOB noted.   Stairs Stairs: Yes Stairs assistance: Supervision Stair Management: One rail Right;Step to pattern;Forwards Number of Stairs: 10 General stair comments: VC's for sequencing and general safety. No assist required.   Wheelchair Mobility    Modified Rankin (Stroke Patients Only)       Balance Overall balance assessment: Mild deficits observed, not formally tested Sitting-balance support: Feet supported;No upper extremity supported Sitting balance-Leahy Scale: Fair     Standing balance support: No upper extremity supported;During functional activity Standing balance-Leahy Scale: Fair                               Pertinent Vitals/Pain Pain Assessment: Faces Faces Pain Scale: Hurts little more Pain Location: Neck Pain Descriptors / Indicators: Operative site guarding Pain Intervention(s): Monitored during session    Home Living Family/patient expects to be discharged to:: Private residence Living Arrangements: Spouse/significant other Available Help at Discharge: Family;Personal care attendant;Available 24 hours/day(until monday) Type of Home: House Home Access: Stairs to enter Entrance Stairs-Rails: Right;Left;Can reach both Entrance Stairs-Number of Steps: 6(garage) Home Layout: Two level;Able to  live on main level with bedroom/bathroom(stair lift to upstairs) Home Equipment: Dan HumphreysWalker - 2 wheels;Cane - single point;Bedside  commode;Shower seat - built in;Wheelchair - manual      Prior Function Level of Independence: Independent         Comments: Pt reports falls in the past. 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 She has an aide for him to assist with physical care - will be available 24 hours until monday (3 days) to assist with both pt and husband.      Hand Dominance        Extremity/Trunk Assessment   Upper Extremity Assessment Upper Extremity Assessment: Defer to OT evaluation    Lower Extremity Assessment Lower Extremity Assessment: Overall WFL for tasks assessed    Cervical / Trunk Assessment Cervical / Trunk Assessment: Other exceptions Cervical / Trunk Exceptions: s/p surgery  Communication   Communication: No difficulties  Cognition Arousal/Alertness: Awake/alert Behavior During Therapy: WFL for tasks assessed/performed Overall Cognitive Status: Within Functional Limits for tasks assessed                                        General Comments      Exercises     Assessment/Plan    PT Assessment Patent does not need any further PT services  PT Problem List Decreased strength;Decreased range of motion;Decreased activity tolerance;Decreased balance;Decreased mobility;Decreased knowledge of use of DME;Decreased safety awareness;Decreased knowledge of precautions;Pain       PT Treatment Interventions      PT Goals (Current goals can be found in the Care Plan section)  Acute Rehab PT Goals Patient Stated Goal: Home today PT Goal Formulation: With patient Time For Goal Achievement: 03/24/17 Potential to Achieve Goals: Good    Frequency     Barriers to discharge        Co-evaluation               AM-PAC PT "6 Clicks" Daily Activity  Outcome Measure Difficulty turning over in bed (including adjusting bedclothes,  sheets and blankets)?: None Difficulty moving from lying on back to sitting on the side of the bed? : A Little Difficulty sitting down on and standing up from a chair with arms (e.g., wheelchair, bedside commode, etc,.)?: A Little Help needed moving to and from a bed to chair (including a wheelchair)?: A Little Help needed walking in hospital room?: A Little Help needed climbing 3-5 steps with a railing? : A Little 6 Click Score: 19    End of Session Equipment Utilized During Treatment: Gait belt;Cervical collar Activity Tolerance: Patient tolerated treatment well Patient left: in bed;with call bell/phone within reach Nurse Communication: Mobility status PT Visit Diagnosis: Unsteadiness on feet (R26.81);Pain;Other symptoms and signs involving the nervous system (R29.898) Pain - part of body: (neck)    Time: 1610-9604 PT Time Calculation (min) (ACUTE ONLY): 23 min   Charges:   PT Evaluation $PT Eval Low Complexity: 1 Low PT Treatments $Gait Training: 8-22 mins   PT G Codes:        Conni Slipper, PT, DPT Acute Rehabilitation Services Pager: [phone]   Marylynn Pearson 03/17/2017, 10:13 AM

## 2017-03-22 NOTE — Discharge Summary (Signed)
Physician Discharge Summary  Patient ID: Lindsay Parks MRN: [id number] DOB/AGE: [birth year] 62 y.o.  Admit date: 03/16/2017 Discharge date: 03/17/17  Admission Diagnoses:  Adjacent segment Cervical DDD   Discharge Diagnoses:  Active Problems:   S/P cervical spinal fusion   Past Medical History:  Diagnosis Date  . Allergy    SEASONAL  . Anxiety   . Arthritis   . Cataract   . Depression   . GERD (gastroesophageal reflux disease)    no longer since cholecystectomy  . History of esophageal stricture   . History of pancreatitis   . Hx of colonic polyps   . 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 Meningitis   . PONV (postoperative nausea and vomiting)   . UTI (lower urinary tract infection)     Surgeries: Procedure(s): ANTERIOR CERVICAL DISCECTOMY AND FUSION FOR DECOMPRESSION ONE  LEVEL CERVICAL SIX-SEVEN  RIGHT SIDED APPROACH on 03/16/2017   Consultants (if any):   Discharged Condition: Improved  Hospital Course: Lindsay Parks is an 62 y.o. female who was admitted 03/16/2017 with a diagnosis of Adjacent segment Cervical DDD and went to the operating room on 03/16/2017 and underwent the above named procedures.  Post op day one pt reports mild surgical pain.  Reports decreased arm pain.  Pt is urinating w/o difficulty.  Pt is ambulating in hallway.  Pt is cleared by PT to br DC'd.   She was given perioperative antibiotics:  Anti-infectives (From admission, onward)   Start     Dose/Rate Route Frequency Ordered Stop   03/17/17 0000  ceFAZolin (ANCEF) IVPB 2g/100 mL premix     2 g 200 mL/hr over 30 Minutes Intravenous Every 8 hours 03/16/17 1951 03/17/17 0750   03/16/17 1222  ceFAZolin (ANCEF) 2-4 GM/100ML-% IVPB    Comments:  Ray Church   : cabinet override      03/16/17 1222 03/16/17 1556   03/16/17 1220  ceFAZolin (ANCEF) IVPB 2g/100 mL premix     2 g 200 mL/hr over 30 Minutes Intravenous 30 min pre-op 03/16/17 1220 03/16/17 1556    .  She was  given sequential compression devices, early ambulation, and TED for DVT prophylaxis.  She benefited maximally from the hospital stay and there were no complications.    Recent vital signs:  Vitals:   03/17/17 0754 03/17/17 1316  BP: 117/65 (!) 151/90  Pulse: 89 85  Resp: 16 16  Temp: 97.9 F (36.6 C) 98 F (36.7 C)  SpO2: 100% 100%    Recent laboratory studies:  Lab Results  Component Value Date   HGB 12.9 03/08/2017   HGB 12.9 08/17/2015   HGB 13.9 05/17/2015   Lab Results  Component Value Date   WBC 5.5 03/08/2017   PLT 244 03/08/2017   No results found for: INR Lab Results  Component Value Date   NA 143 08/17/2015   K 4.7 08/17/2015   CL 105 08/17/2015   CO2 30 08/17/2015   BUN 11 08/17/2015   CREATININE 1.10 08/17/2015   GLUCOSE 86 08/17/2015    Discharge Medications:   Allergies as of 03/17/2017   No Known Allergies     Medication List    STOP taking these medications   acetaminophen 500 MG tablet Commonly known as:  TYLENOL   BC FAST PAIN RELIEF 650-195-33.3 MG Pack Generic drug:  Aspirin-Salicylamide-Caffeine   cephALEXin 500 MG capsule Commonly known as:  KEFLEX   naproxen sodium 220 MG tablet  Commonly known as:  ALEVE   zolpidem 10 MG tablet Commonly known as:  AMBIEN     TAKE these medications   calcium-vitamin D 500-200 MG-UNIT tablet Commonly known as:  OSCAL WITH D Take 1 tablet by mouth daily.   diphenhydrAMINE 25 MG tablet Commonly known as:  BENADRYL Take 25 mg by mouth every 8 (eight) hours as needed for allergies (allergy headaches.).   DULoxetine 60 MG capsule Commonly known as:  CYMBALTA Take 60 mg by mouth daily.   estradiol 0.075 MG/24HR Commonly known as:  VIVELLE-DOT Place 1 patch onto the skin 2 (two) times a week. Please schedule OV and labs for further refills. Thanks. What changed:  additional instructions   fluocinonide cream 0.05 % Commonly known as:  LIDEX Apply 1 application topically 2 (two) times  daily as needed (for eczema).   LORazepam 0.5 MG tablet Commonly known as:  ATIVAN Take 0.5 mg by mouth daily.   methocarbamol 500 MG tablet Commonly known as:  ROBAXIN Take 1 tablet (500 mg total) by mouth 3 (three) times daily.   ondansetron 4 MG disintegrating tablet Commonly known as:  ZOFRAN ODT Take 1 tablet (4 mg total) by mouth every 8 (eight) hours as needed for nausea or vomiting.   oxyCODONE-acetaminophen 10-325 MG tablet Commonly known as:  PERCOCET Take 1 tablet by mouth every 6 (six) hours as needed for pain.       Diagnostic Studies: Dg Cervical Spine 2-3 Views  Result Date: 03/16/2017 CLINICAL DATA:  Anterior cervical discectomy and fusion for decompression, one level at C6-7 via right-sided approach. EXAM: DG C-ARM 61-120 MIN; CERVICAL SPINE - 2-3 VIEW COMPARISON:  None. FINDINGS: 1 minutes 2 seconds of fluoroscopic time was utilized. Two views of the lower cervical spine demonstrate ACDF across the C6-7 interspace. Additionally there is anterior plate and screw fixation across the C4 through C6 interspace. No immediate complications. IMPRESSION: 1. One level ACDF at C6-7. No immediate postoperative complications. 2. Additionally, anterior plate and screw fixation across the C4 through C6 interspace with interbody blocks are also noted without complication. Electronically Signed   By: Tollie Eth M.D.   On: 03/16/2017 20:20   Dg C-arm 1-60 Min  Result Date: 03/16/2017 CLINICAL DATA:  Anterior cervical discectomy and fusion for decompression, one level at C6-7 via right-sided approach. EXAM: DG C-ARM 61-120 MIN; CERVICAL SPINE - 2-3 VIEW COMPARISON:  None. FINDINGS: 1 minutes 2 seconds of fluoroscopic time was utilized. Two views of the lower cervical spine demonstrate ACDF across the C6-7 interspace. Additionally there is anterior plate and screw fixation across the C4 through C6 interspace. No immediate complications. IMPRESSION: 1. One level ACDF at C6-7. No immediate  postoperative complications. 2. Additionally, anterior plate and screw fixation across the C4 through C6 interspace with interbody blocks are also noted without complication. Electronically Signed   By: Tollie Eth M.D.   On: 03/16/2017 20:20   Dg C-arm 1-60 Min  Result Date: 03/16/2017 CLINICAL DATA:  Anterior cervical discectomy and fusion for decompression, one level at C6-7 via right-sided approach. EXAM: DG C-ARM 61-120 MIN; CERVICAL SPINE - 2-3 VIEW COMPARISON:  None. FINDINGS: 1 minutes 2 seconds of fluoroscopic time was utilized. Two views of the lower cervical spine demonstrate ACDF across the C6-7 interspace. Additionally there is anterior plate and screw fixation across the C4 through C6 interspace. No immediate complications. IMPRESSION: 1. One level ACDF at C6-7. No immediate postoperative complications. 2. Additionally, anterior plate and screw fixation across the  C4 through C6 interspace with interbody blocks are also noted without complication. Electronically Signed   By: Tollie Ethavid  Kwon M.D.   On: 03/16/2017 20:20    Disposition:  Pt will present to clinic in 2 weeks Post op medication provided  Discharge Instructions    Incentive spirometry RT   Complete by:  As directed       Follow-up Information    Venita LickBrooks, Dahari, MD Follow up in 2 week(s).   Specialty:  Orthopedic Surgery Contact information: [address] [place] [id number] [phone]            Signed: Kirt BoysMayo, Carmen Christina 03/22/2017, 8:07 AM

## 2017-03-29 ENCOUNTER — Other Ambulatory Visit: Payer: Self-pay | Admitting: Internal Medicine

## 2017-04-19 NOTE — Progress Notes (Signed)
Chief Complaint  Patient presents with  . Annual Exam    discuss shingles shot, discuss weight    HPI: Patient  Lindsay Parks  62 y.o. comes in today for Preventive Health Care visit     Had c spine surgery  In march   Hard to lose weight  Mood : psc inc cymbalta to 90 for past 2 weeks using ambien at night and ativan once a day  caretaking husb  Medical and mom. Also has  Agricultural consultant and project business .   bp has been ok    Legs ok  New stockings  No recent infection  Asks about   Help with weight loss  HRT  Gets sx if misses some  So wants to remain on same dose.   Health Maintenance  Topic Date Due  . Hepatitis C Screening  1955/09/10  . HIV Screening  09/15/1970  . COLONOSCOPY  05/16/2016  . INFLUENZA VACCINE  08/03/2017  . MAMMOGRAM  10/28/2018  . TETANUS/TDAP  04/26/2020   Health Maintenance Review LIFESTYLE:  Exercise:   Some resistance jsut had  Neck surgery Tobacco/ETS:n Alcohol: soical Sugar beverages:n Sleep: on meds ok  Drug use: no HH of 2 Work:y see above Does snack at night  After all settled down    ROS:  See above  GEN/ HEENT: No fever,  sweats headaches vision problems hearing changes, CV/ PULM; No chest pain shortness of breath cough, syncope,edema  change in exercise tolerance. GI /GU: No adominal pain, vomiting, change in bowel habits. No blood in the stool. No significant GU symptoms. SKIN/HEME: ,no acute skin rashes suspicious lesions or bleeding. No lymphadenopathy, nodules, masses.  NEURO/ PSYCH:  No neurologic signs such as weakness numbness. No depression anxiety. IMM/ Allergy: No unusual infections.  Allergy .   REST of 12 system review negative except as per HPI   Past Medical History:  Diagnosis Date  . Allergy    SEASONAL  . Anxiety   . Arthritis   . Cataract   . Depression   . GERD (gastroesophageal reflux disease)    no longer since cholecystectomy  . History of esophageal stricture   . History of pancreatitis    . Hx of colonic polyps   . 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 Meningitis   . PONV (postoperative nausea and vomiting)   . UTI (lower urinary tract infection)     Past Surgical History:  Procedure Laterality Date  . ANTERIOR CERVICAL DECOMP/DISCECTOMY FUSION N/A 03/16/2017   Procedure: ANTERIOR CERVICAL DISCECTOMY AND FUSION FOR DECOMPRESSION ONE  LEVEL CERVICAL SIX-SEVEN  RIGHT SIDED APPROACH;  Surgeon: Melina Schools, MD;  Location: [facility];  Service: Orthopedics;  Laterality: N/A;  . CERVICAL DISCECTOMY     c5 and c6  . CHOLECYSTECTOMY    . COLONOSCOPY    . ELBOW SURGERY    . ESOPHAGOGASTRODUODENOSCOPY    . FLEXIBLE SIGMOIDOSCOPY    . PARTIAL HYSTERECTOMY  2005   left hydrosalpinx ovarian cyst  . SHOULDER SURGERY    . TOTAL ABDOMINAL HYSTERECTOMY      Family History  Problem Relation Age of Onset  . Stroke Mother   . Diabetes Mother   . Hypertension Mother   . Heart disease Mother   . Osteoporosis Mother   . Arthritis Mother   . Hyperlipidemia Mother   . Glaucoma Father   . Hypertension Father   . Gout Father   .  Hyperlipidemia Father   . Cancer Father   . Colon cancer Paternal Grandmother        aunts and uncles  . Pancreatic cancer Paternal Grandfather        lung, and stomach  . Diabetes Other        grandson  . Heart disease Maternal Grandmother   . Kidney disease Paternal Uncle     Social History   Socioeconomic History  . Marital status: Married    Spouse name: Not on file  . Number of children: 2  . Years of education: Not on file  . Highest education level: Not on file  Occupational History  . Occupation: retired  Scientific laboratory technician  . Financial resource strain: Not on file  . Food insecurity:    Worry: Not on file    Inability: Not on file  . Transportation needs:    Medical: Not on file    Non-medical: Not on file  Tobacco Use  . Smoking status: Never Smoker  . Smokeless tobacco: Never Used  Substance and Sexual Activity    . Alcohol use: Yes    Comment: socially  . Drug use: No  . Sexual activity: Not on file  Lifestyle  . Physical activity:    Days per week: Not on file    Minutes per session: Not on file  . Stress: Not on file  Relationships  . Social connections:    Talks on phone: Not on file    Gets together: Not on file    Attends religious service: Not on file    Active member of club or organization: Not on file    Attends meetings of clubs or organizations: Not on file    Relationship status: Not on file  Other Topics Concern  . Not on file  Social History Narrative   Regular exercise- yes   Occupation: retired from [employer]   Married husband with parkinson's   2 children   care taking husband parkinson's  And  Elderly mom       Finished degree i    Outpatient Medications Prior to Visit  Medication Sig Dispense Refill  . calcium-vitamin D (OSCAL WITH D) 500-200 MG-UNIT per tablet Take 1 tablet by mouth daily.      . diphenhydrAMINE (BENADRYL) 25 MG tablet Take 25 mg by mouth every 8 (eight) hours as needed for allergies (allergy headaches.).     Marland Kitchen DULoxetine (CYMBALTA) 60 MG capsule Take 90 mg by mouth daily.     . fluocinonide (LIDEX) 0.05 % cream Apply 1 application topically 2 (two) times daily as needed (for eczema).     . LORazepam (ATIVAN) 0.5 MG tablet Take 0.5 mg by mouth daily.  0  . estradiol (VIVELLE-DOT) 0.075 MG/24HR Place 1 patch onto the skin 2 (two) times a week. Please schedule OV and labs for further refills. Thanks. (Patient taking differently: Place 1 patch onto the skin 2 (two) times a week. Wednesdays & Sundays.) 12 patch 0  . methocarbamol (ROBAXIN) 500 MG tablet Take 1 tablet (500 mg total) by mouth 3 (three) times daily. (Patient not taking: Reported on 04/20/2017) 30 tablet 0  . ondansetron (ZOFRAN ODT) 4 MG disintegrating tablet Take 1 tablet (4 mg total) by mouth every 8 (eight) hours as needed for nausea or vomiting. (Patient not taking: Reported on  04/20/2017) 20 tablet 0  . oxyCODONE-acetaminophen (PERCOCET) 10-325 MG tablet Take 1 tablet by mouth every 6 (six) hours as needed for pain. (  Patient not taking: Reported on 04/20/2017) 20 tablet 0   No facility-administered medications prior to visit.      EXAM:  BP 124/78 (BP Location: Left Arm, Patient Position: Sitting, Cuff Size: Normal)   Pulse 90   Temp 97.9 F (36.6 C) (Oral)   Ht 5' 6.25" (1.683 m)   Wt 207 lb 3.2 oz (94 kg)   SpO2 98%   BMI 33.19 kg/m   Body mass index is 33.19 kg/m. Wt Readings from Last 3 Encounters:  04/20/17 207 lb 3.2 oz (94 kg)  03/16/17 209 lb (94.8 kg)  03/08/17 209 lb 9 oz (95.1 kg)    Physical Exam: Vital signs reviewed PNT:IRWE is a well-developed well-nourished alert cooperative    who appearsr stated age in no acute distress.  HEENT: normocephalic atraumatic , Eyes: PERRL EOM's full, conjunctiva clear, Nares: paten,t no deformity discharge or tenderness., Ears: no deformity EAC's clear TMs with normal landmarks. Mouth: clear OP, no lesions, edema.  Moist mucous membranes. Dentition in adequate repair. NECK: supple without masses, thyromegaly or bruits. Healed scar  CHEST/PULM:  Clear to auscultation and percussion breath sounds equal no wheeze , rales or rhonchi. No chest wall deformities or tenderness. Breast: normal by inspection . No dimpling, discharge, masses, tenderness or discharge . CV: PMI is nondisplaced, S1 S2 no gallops, murmurs, rubs. Peripheral pulses are full without delay.No JVD .   Legs in compression left more than right   ABDOMEN: Bowel sounds normal nontender  No guard or rebound, no hepato splenomegal no CVA tenderness.   Extremtities:  No clubbing cyanosis see above  Compression left 3+ size of right  No redness no acute joint swelling or redness no focal atrophy NEURO:  Oriented x3, cranial nerves 3-12 appear to be intact, no obvious focal weakness,gait within normal limits no abnormal reflexes or asymmetrical SKIN:  No acute rashes normal turgor, color, no bruising or petechiae. PSYCH: Oriented, good eye contact, no obvious depression anxiety, cognition and judgment appear normal. LN: no cervical axillary inguinal adenopathy Wt Readings from Last 3 Encounters:  04/20/17 207 lb 3.2 oz (94 kg)  03/16/17 209 lb (94.8 kg)  03/08/17 209 lb 9 oz (95.1 kg)     BP Readings from Last 3 Encounters:  04/20/17 124/78  03/17/17 (!) 151/90  03/08/17 136/60     ASSESSMENT AND PLAN:  Discussed the following assessment and plan:  Visit for preventive health examination - Plan: Basic metabolic panel, CBC with Differential/Platelet, Hepatic function panel, Lipid panel, TSH  MILROY'S DISEASE - Plan: Basic metabolic panel, CBC with Differential/Platelet, Hepatic function panel, Lipid panel, TSH  Medication management - Plan: Basic metabolic panel, CBC with Differential/Platelet, Hepatic function panel, Lipid panel, TSH  Hyperlipidemia, unspecified hyperlipidemia type - Plan: Basic metabolic panel, CBC with Differential/Platelet, Hepatic function panel, Lipid panel, TSH  Weight gain - Plan: Basic metabolic panel, CBC with Differential/Platelet, Hepatic function panel, Lipid panel, TSH  Postmenopausal HRT (hormone replacement therapy) She is frustrated about getting   Weight control  Plan made  And fu  consideration of medication but limited benefit in  most but can help sometimes   But have to watch interaction Ok to refill hrt wants to wiat on weaning attempts  Patient Care Team: Jalyne Brodzinski, Standley Brooking, MD as PCP - General O'Neal, Lamount Cranker (Nurse Practitioner) Wylene Simmer, MD as Consulting Physician (Orthopedic Surgery) Patient Instructions  Track intake as we discussed   For at least 2 weeks and then  Ongoing .  Weight loss attempts usually fail unless ongoing tracking . Work on late night food  Change eating cues . Try 2 weeks with no processed carbs( includes bread and snacks etc )  Plan rov in 1 months  with  Plan and then we can decide  On  Medication that could help .   Some of these meds can increase Bp in their own right   Health Maintenance, Female Adopting a healthy lifestyle and getting preventive care can go a long way to promote health and wellness. Talk with your health care provider about what schedule of regular examinations is right for you. This is a good chance for you to check in with your provider about disease prevention and staying healthy. In between checkups, there are plenty of things you can do on your own. Experts have done a lot of research about which lifestyle changes and preventive measures are most likely to keep you healthy. Ask your health care provider for more information. Weight and diet Eat a healthy diet  Be sure to include plenty of vegetables, fruits, low-fat dairy products, and lean protein.  Do not eat a lot of foods high in solid fats, added sugars, or salt.  Get regular exercise. This is one of the most important things you can do for your health. ? Most adults should exercise for at least 150 minutes each week. The exercise should increase your heart rate and make you sweat (moderate-intensity exercise). ? Most adults should also do strengthening exercises at least twice a week. This is in addition to the moderate-intensity exercise.  Maintain a healthy weight  Body mass index (BMI) is a measurement that can be used to identify possible weight problems. It estimates body fat based on height and weight. Your health care provider can help determine your BMI and help you achieve or maintain a healthy weight.  For females 40 years of age and older: ? A BMI below 18.5 is considered underweight. ? A BMI of 18.5 to 24.9 is normal. ? A BMI of 25 to 29.9 is considered overweight. ? A BMI of 30 and above is considered obese.  Watch levels of cholesterol and blood lipids  You should start having your blood tested for lipids and cholesterol at 62 years of  age, then have this test every 5 years.  You may need to have your cholesterol levels checked more often if: ? Your lipid or cholesterol levels are high. ? You are older than 62 years of age. ? You are at high risk for heart disease.  Cancer screening Lung Cancer  Lung cancer screening is recommended for adults 53-53 years old who are at high risk for lung cancer because of a history of smoking.  A yearly low-dose CT scan of the lungs is recommended for people who: ? Currently smoke. ? Have quit within the past 15 years. ? Have at least a 30-pack-year history of smoking. A pack year is smoking an average of one pack of cigarettes a day for 1 year.  Yearly screening should continue until it has been 15 years since you quit.  Yearly screening should stop if you develop a health problem that would prevent you from having lung cancer treatment.  Breast Cancer  Practice breast self-awareness. This means understanding how your breasts normally appear and feel.  It also means doing regular breast self-exams. Let your health care provider know about any changes, no matter how small.  If you are in your 20s or 30s,  you should have a clinical breast exam (CBE) by a health care provider every 1-3 years as part of a regular health exam.  If you are 26 or older, have a CBE every year. Also consider having a breast X-ray (mammogram) every year.  If you have a family history of breast cancer, talk to your health care provider about genetic screening.  If you are at high risk for breast cancer, talk to your health care provider about having an MRI and a mammogram every year.  Breast cancer gene (BRCA) assessment is recommended for women who have family members with BRCA-related cancers. BRCA-related cancers include: ? Breast. ? Ovarian. ? Tubal. ? Peritoneal cancers.  Results of the assessment will determine the need for genetic counseling and BRCA1 and BRCA2 testing.  Cervical  Cancer Your health care provider may recommend that you be screened regularly for cancer of the pelvic organs (ovaries, uterus, and vagina). This screening involves a pelvic examination, including checking for microscopic changes to the surface of your cervix (Pap test). You may be encouraged to have this screening done every 3 years, beginning at age 52.  For women ages 58-65, health care providers may recommend pelvic exams and Pap testing every 3 years, or they may recommend the Pap and pelvic exam, combined with testing for human papilloma virus (HPV), every 5 years. Some types of HPV increase your risk of cervical cancer. Testing for HPV may also be done on women of any age with unclear Pap test results.  Other health care providers may not recommend any screening for nonpregnant women who are considered low risk for pelvic cancer and who do not have symptoms. Ask your health care provider if a screening pelvic exam is right for you.  If you have had past treatment for cervical cancer or a condition that could lead to cancer, you need Pap tests and screening for cancer for at least 20 years after your treatment. If Pap tests have been discontinued, your risk factors (such as having a new sexual partner) need to be reassessed to determine if screening should resume. Some women have medical problems that increase the chance of getting cervical cancer. In these cases, your health care provider may recommend more frequent screening and Pap tests.  Colorectal Cancer  This type of cancer can be detected and often prevented.  Routine colorectal cancer screening usually begins at 62 years of age and continues through 62 years of age.  Your health care provider may recommend screening at an earlier age if you have risk factors for colon cancer.  Your health care provider may also recommend using home test kits to check for hidden blood in the stool.  A small camera at the end of a tube can be used to  examine your colon directly (sigmoidoscopy or colonoscopy). This is done to check for the earliest forms of colorectal cancer.  Routine screening usually begins at age 93.  Direct examination of the colon should be repeated every 5-10 years through 62 years of age. However, you may need to be screened more often if early forms of precancerous polyps or small growths are found.  Skin Cancer  Check your skin from head to toe regularly.  Tell your health care provider about any new moles or changes in moles, especially if there is a change in a mole's shape or color.  Also tell your health care provider if you have a mole that is larger than the size of a pencil eraser.  Always use sunscreen. Apply sunscreen liberally and repeatedly throughout the day.  Protect yourself by wearing long sleeves, pants, a wide-brimmed hat, and sunglasses whenever you are outside.  Heart disease, diabetes, and high blood pressure  High blood pressure causes heart disease and increases the risk of stroke. High blood pressure is more likely to develop in: ? People who have blood pressure in the high end of the normal range (130-139/85-89 mm Hg). ? People who are overweight or obese. ? People who are African American.  If you are 15-57 years of age, have your blood pressure checked every 3-5 years. If you are 36 years of age or older, have your blood pressure checked every year. You should have your blood pressure measured twice-once when you are at a hospital or clinic, and once when you are not at a hospital or clinic. Record the average of the two measurements. To check your blood pressure when you are not at a hospital or clinic, you can use: ? An automated blood pressure machine at a pharmacy. ? A home blood pressure monitor.  If you are between 65 years and 91 years old, ask your health care provider if you should take aspirin to prevent strokes.  Have regular diabetes screenings. This involves taking a  blood sample to check your fasting blood sugar level. ? If you are at a normal weight and have a low risk for diabetes, have this test once every three years after 62 years of age. ? If you are overweight and have a high risk for diabetes, consider being tested at a younger age or more often. Preventing infection Hepatitis B  If you have a higher risk for hepatitis B, you should be screened for this virus. You are considered at high risk for hepatitis B if: ? You were born in a country where hepatitis B is common. Ask your health care provider which countries are considered high risk. ? Your parents were born in a high-risk country, and you have not been immunized against hepatitis B (hepatitis B vaccine). ? You have HIV or AIDS. ? You use needles to inject street drugs. ? You live with someone who has hepatitis B. ? You have had sex with someone who has hepatitis B. ? You get hemodialysis treatment. ? You take certain medicines for conditions, including cancer, organ transplantation, and autoimmune conditions.  Hepatitis C  Blood testing is recommended for: ? Everyone born from [birth year] through 1965. ? Anyone with known risk factors for hepatitis C.  Sexually transmitted infections (STIs)  You should be screened for sexually transmitted infections (STIs) including gonorrhea and chlamydia if: ? You are sexually active and are younger than 62 years of age. ? You are older than 62 years of age and your health care provider tells you that you are at risk for this type of infection. ? Your sexual activity has changed since you were last screened and you are at an increased risk for chlamydia or gonorrhea. Ask your health care provider if you are at risk.  If you do not have HIV, but are at risk, it may be recommended that you take a prescription medicine daily to prevent HIV infection. This is called pre-exposure prophylaxis (PrEP). You are considered at risk if: ? You are sexually active and  do not regularly use condoms or know the HIV status of your partner(s). ? You take drugs by injection. ? You are sexually active with a partner who has HIV.  Talk with  your health care provider about whether you are at high risk of being infected with HIV. If you choose to begin PrEP, you should first be tested for HIV. You should then be tested every 3 months for as long as you are taking PrEP. Pregnancy  If you are premenopausal and you may become pregnant, ask your health care provider about preconception counseling.  If you may become pregnant, take 400 to 800 micrograms (mcg) of folic acid every day.  If you want to prevent pregnancy, talk to your health care provider about birth control (contraception). Osteoporosis and menopause  Osteoporosis is a disease in which the bones lose minerals and strength with aging. This can result in serious bone fractures. Your risk for osteoporosis can be identified using a bone density scan.  If you are 65 years of age or older, or if you are at risk for osteoporosis and fractures, ask your health care provider if you should be screened.  Ask your health care provider whether you should take a calcium or vitamin D supplement to lower your risk for osteoporosis.  Menopause may have certain physical symptoms and risks.  Hormone replacement therapy may reduce some of these symptoms and risks. Talk to your health care provider about whether hormone replacement therapy is right for you. Follow these instructions at home:  Schedule regular health, dental, and eye exams.  Stay current with your immunizations.  Do not use any tobacco products including cigarettes, chewing tobacco, or electronic cigarettes.  If you are pregnant, do not drink alcohol.  If you are breastfeeding, limit how much and how often you drink alcohol.  Limit alcohol intake to no more than 1 drink per day for nonpregnant women. One drink equals 12 ounces of beer, 5 ounces of  wine, or 1 ounces of hard liquor.  Do not use street drugs.  Do not share needles.  Ask your health care provider for help if you need support or information about quitting drugs.  Tell your health care provider if you often feel depressed.  Tell your health care provider if you have ever been abused or do not feel safe at home. This information is not intended to replace advice given to you by your health care provider. Make sure you discuss any questions you have with your health care provider. Document Released: 07/05/2010 Document Revised: 05/28/2015 Document Reviewed: 09/23/2014 Elsevier Interactive Patient Education  2018 [place]. Shirlene Andaya M.D.

## 2017-04-20 ENCOUNTER — Ambulatory Visit (INDEPENDENT_AMBULATORY_CARE_PROVIDER_SITE_OTHER): Payer: Federal, State, Local not specified - PPO | Admitting: Internal Medicine

## 2017-04-20 ENCOUNTER — Encounter: Payer: Self-pay | Admitting: Internal Medicine

## 2017-04-20 VITALS — BP 124/78 | HR 90 | Temp 97.9°F | Ht 66.25 in | Wt 207.2 lb

## 2017-04-20 DIAGNOSIS — E785 Hyperlipidemia, unspecified: Secondary | ICD-10-CM | POA: Diagnosis not present

## 2017-04-20 DIAGNOSIS — Z79899 Other long term (current) drug therapy: Secondary | ICD-10-CM | POA: Diagnosis not present

## 2017-04-20 DIAGNOSIS — Z79890 Hormone replacement therapy (postmenopausal): Secondary | ICD-10-CM | POA: Diagnosis not present

## 2017-04-20 DIAGNOSIS — Z0000 Encounter for general adult medical examination without abnormal findings: Secondary | ICD-10-CM | POA: Diagnosis not present

## 2017-04-20 DIAGNOSIS — Q820 Hereditary lymphedema: Secondary | ICD-10-CM | POA: Diagnosis not present

## 2017-04-20 DIAGNOSIS — R635 Abnormal weight gain: Secondary | ICD-10-CM | POA: Diagnosis not present

## 2017-04-20 LAB — BASIC METABOLIC PANEL
BUN: 13 mg/dL (ref 6–23)
CALCIUM: 9.4 mg/dL (ref 8.4–10.5)
CO2: 30 mEq/L (ref 19–32)
Chloride: 103 mEq/L (ref 96–112)
Creatinine, Ser: 1.04 mg/dL (ref 0.40–1.20)
GFR: 69.15 mL/min (ref >60.00)
GLUCOSE: 95 mg/dL (ref 70–99)
Potassium: 4.7 mEq/L (ref 3.5–5.1)
SODIUM: 139 meq/L (ref 135–145)

## 2017-04-20 LAB — CBC WITH DIFFERENTIAL/PLATELET
BASOS ABS: 0 10*3/uL (ref 0.0–0.1)
Basophils Relative: 0.7 % (ref 0.0–3.0)
Eosinophils Absolute: 0.1 10*3/uL (ref 0.0–0.7)
Eosinophils Relative: 1.4 % (ref 0.0–5.0)
HCT: 39.9 % (ref 36.0–46.0)
Hemoglobin: 13.3 g/dL (ref 12.0–15.0)
LYMPHS ABS: 1.5 10*3/uL (ref 0.7–4.0)
Lymphocytes Relative: 24.4 % (ref 12.0–46.0)
MCHC: 33.4 g/dL (ref 30.0–36.0)
MCV: 88.2 fl (ref 78.0–100.0)
MONOS PCT: 5 % (ref 3.0–12.0)
Monocytes Absolute: 0.3 10*3/uL (ref 0.1–1.0)
NEUTROS PCT: 68.5 % (ref 43.0–77.0)
Neutro Abs: 4.1 10*3/uL (ref 1.4–7.7)
Platelets: 256 10*3/uL (ref 150.0–400.0)
RBC: 4.53 Mil/uL (ref 3.87–5.11)
RDW: 13.3 % (ref 11.5–15.5)
WBC: 6 10*3/uL (ref 4.0–10.5)

## 2017-04-20 LAB — LIPID PANEL
Cholesterol: 258 mg/dL — ABNORMAL HIGH (ref 0–200)
HDL: 69.7 mg/dL (ref >39.00)
LDL CALC: 178 mg/dL — AB (ref 0–99)
NonHDL: 188.36
Total CHOL/HDL Ratio: 4
Triglycerides: 50 mg/dL (ref 0.0–149.0)
VLDL: 10 mg/dL (ref 0.0–40.0)

## 2017-04-20 LAB — HEPATIC FUNCTION PANEL
ALK PHOS: 88 U/L (ref 39–117)
ALT: 13 U/L (ref 0–35)
AST: 16 U/L (ref 0–37)
Albumin: 4.2 g/dL (ref 3.5–5.2)
Bilirubin, Direct: 0.1 mg/dL (ref 0.0–0.3)
Total Bilirubin: 0.4 mg/dL (ref 0.2–1.2)
Total Protein: 6.3 g/dL (ref 6.0–8.3)

## 2017-04-20 LAB — TSH: TSH: 1.3 u[IU]/mL (ref 0.35–4.50)

## 2017-04-20 MED ORDER — ZOSTER VAC RECOMB ADJUVANTED 50 MCG/0.5ML IM SUSR: 0.5000 mL | Freq: Once | INTRAMUSCULAR | 1 refills | Status: AC

## 2017-04-20 MED ORDER — ESTRADIOL 0.075 MG/24HR TD PTTW: 1.0000 | MEDICATED_PATCH | TRANSDERMAL | 11 refills | Status: DC

## 2017-04-20 NOTE — Patient Instructions (Addendum)
Track intake as we discussed   For at least 2 weeks and then  Ongoing .   Weight loss attempts usually fail unless ongoing tracking . Work on late night food  Change eating cues . Try 2 weeks with no processed carbs( includes bread and snacks etc )  Plan rov in 1 months with  Plan and then we can decide  On  Medication that could help .   Some of these meds can increase Bp in their own right   Health Maintenance, Female Adopting a healthy lifestyle and getting preventive care can go a long way to promote health and wellness. Talk with your health care provider about what schedule of regular examinations is right for you. This is a good chance for you to check in with your provider about disease prevention and staying healthy. In between checkups, there are plenty of things you can do on your own. Experts have done a lot of research about which lifestyle changes and preventive measures are most likely to keep you healthy. Ask your health care provider for more information. Weight and diet Eat a healthy diet  Be sure to include plenty of vegetables, fruits, low-fat dairy products, and lean protein.  Do not eat a lot of foods high in solid fats, added sugars, or salt.  Get regular exercise. This is one of the most important things you can do for your health. ? Most adults should exercise for at least 150 minutes each week. The exercise should increase your heart rate and make you sweat (moderate-intensity exercise). ? Most adults should also do strengthening exercises at least twice a week. This is in addition to the moderate-intensity exercise.  Maintain a healthy weight  Body mass index (BMI) is a measurement that can be used to identify possible weight problems. It estimates body fat based on height and weight. Your health care provider can help determine your BMI and help you achieve or maintain a healthy weight.  For females 16 years of age and older: ? A BMI below 18.5 is considered  underweight. ? A BMI of 18.5 to 24.9 is normal. ? A BMI of 25 to 29.9 is considered overweight. ? A BMI of 30 and above is considered obese.  Watch levels of cholesterol and blood lipids  You should start having your blood tested for lipids and cholesterol at 62 years of age, then have this test every 5 years.  You may need to have your cholesterol levels checked more often if: ? Your lipid or cholesterol levels are high. ? You are older than 62 years of age. ? You are at high risk for heart disease.  Cancer screening Lung Cancer  Lung cancer screening is recommended for adults 19-70 years old who are at high risk for lung cancer because of a history of smoking.  A yearly low-dose CT scan of the lungs is recommended for people who: ? Currently smoke. ? Have quit within the past 15 years. ? Have at least a 30-pack-year history of smoking. A pack year is smoking an average of one pack of cigarettes a day for 1 year.  Yearly screening should continue until it has been 15 years since you quit.  Yearly screening should stop if you develop a health problem that would prevent you from having lung cancer treatment.  Breast Cancer  Practice breast self-awareness. This means understanding how your breasts normally appear and feel.  It also means doing regular breast self-exams. Let your health care  provider know about any changes, no matter how small.  If you are in your 20s or 30s, you should have a clinical breast exam (CBE) by a health care provider every 1-3 years as part of a regular health exam.  If you are 63 or older, have a CBE every year. Also consider having a breast X-ray (mammogram) every year.  If you have a family history of breast cancer, talk to your health care provider about genetic screening.  If you are at high risk for breast cancer, talk to your health care provider about having an MRI and a mammogram every year.  Breast cancer gene (BRCA) assessment is  recommended for women who have family members with BRCA-related cancers. BRCA-related cancers include: ? Breast. ? Ovarian. ? Tubal. ? Peritoneal cancers.  Results of the assessment will determine the need for genetic counseling and BRCA1 and BRCA2 testing.  Cervical Cancer Your health care provider may recommend that you be screened regularly for cancer of the pelvic organs (ovaries, uterus, and vagina). This screening involves a pelvic examination, including checking for microscopic changes to the surface of your cervix (Pap test). You may be encouraged to have this screening done every 3 years, beginning at age 53.  For women ages 43-65, health care providers may recommend pelvic exams and Pap testing every 3 years, or they may recommend the Pap and pelvic exam, combined with testing for human papilloma virus (HPV), every 5 years. Some types of HPV increase your risk of cervical cancer. Testing for HPV may also be done on women of any age with unclear Pap test results.  Other health care providers may not recommend any screening for nonpregnant women who are considered low risk for pelvic cancer and who do not have symptoms. Ask your health care provider if a screening pelvic exam is right for you.  If you have had past treatment for cervical cancer or a condition that could lead to cancer, you need Pap tests and screening for cancer for at least 20 years after your treatment. If Pap tests have been discontinued, your risk factors (such as having a new sexual partner) need to be reassessed to determine if screening should resume. Some women have medical problems that increase the chance of getting cervical cancer. In these cases, your health care provider may recommend more frequent screening and Pap tests.  Colorectal Cancer  This type of cancer can be detected and often prevented.  Routine colorectal cancer screening usually begins at 62 years of age and continues through 62 years of  age.  Your health care provider may recommend screening at an earlier age if you have risk factors for colon cancer.  Your health care provider may also recommend using home test kits to check for hidden blood in the stool.  A small camera at the end of a tube can be used to examine your colon directly (sigmoidoscopy or colonoscopy). This is done to check for the earliest forms of colorectal cancer.  Routine screening usually begins at age 64.  Direct examination of the colon should be repeated every 5-10 years through 62 years of age. However, you may need to be screened more often if early forms of precancerous polyps or small growths are found.  Skin Cancer  Check your skin from head to toe regularly.  Tell your health care provider about any new moles or changes in moles, especially if there is a change in a mole's shape or color.  Also tell your  health care provider if you have a mole that is larger than the size of a pencil eraser.  Always use sunscreen. Apply sunscreen liberally and repeatedly throughout the day.  Protect yourself by wearing long sleeves, pants, a wide-brimmed hat, and sunglasses whenever you are outside.  Heart disease, diabetes, and high blood pressure  High blood pressure causes heart disease and increases the risk of stroke. High blood pressure is more likely to develop in: ? People who have blood pressure in the high end of the normal range (130-139/85-89 mm Hg). ? People who are overweight or obese. ? People who are African American.  If you are 4-13 years of age, have your blood pressure checked every 3-5 years. If you are 27 years of age or older, have your blood pressure checked every year. You should have your blood pressure measured twice-once when you are at a hospital or clinic, and once when you are not at a hospital or clinic. Record the average of the two measurements. To check your blood pressure when you are not at a hospital or clinic, you  can use: ? An automated blood pressure machine at a pharmacy. ? A home blood pressure monitor.  If you are between 34 years and 75 years old, ask your health care provider if you should take aspirin to prevent strokes.  Have regular diabetes screenings. This involves taking a blood sample to check your fasting blood sugar level. ? If you are at a normal weight and have a low risk for diabetes, have this test once every three years after 62 years of age. ? If you are overweight and have a high risk for diabetes, consider being tested at a younger age or more often. Preventing infection Hepatitis B  If you have a higher risk for hepatitis B, you should be screened for this virus. You are considered at high risk for hepatitis B if: ? You were born in a country where hepatitis B is common. Ask your health care provider which countries are considered high risk. ? Your parents were born in a high-risk country, and you have not been immunized against hepatitis B (hepatitis B vaccine). ? You have HIV or AIDS. ? You use needles to inject street drugs. ? You live with someone who has hepatitis B. ? You have had sex with someone who has hepatitis B. ? You get hemodialysis treatment. ? You take certain medicines for conditions, including cancer, organ transplantation, and autoimmune conditions.  Hepatitis C  Blood testing is recommended for: ? Everyone born from [birth year] through 1965. ? Anyone with known risk factors for hepatitis C.  Sexually transmitted infections (STIs)  You should be screened for sexually transmitted infections (STIs) including gonorrhea and chlamydia if: ? You are sexually active and are younger than 62 years of age. ? You are older than 62 years of age and your health care provider tells you that you are at risk for this type of infection. ? Your sexual activity has changed since you were last screened and you are at an increased risk for chlamydia or gonorrhea. Ask your  health care provider if you are at risk.  If you do not have HIV, but are at risk, it may be recommended that you take a prescription medicine daily to prevent HIV infection. This is called pre-exposure prophylaxis (PrEP). You are considered at risk if: ? You are sexually active and do not regularly use condoms or know the HIV status of your partner(s). ?  You take drugs by injection. ? You are sexually active with a partner who has HIV.  Talk with your health care provider about whether you are at high risk of being infected with HIV. If you choose to begin PrEP, you should first be tested for HIV. You should then be tested every 3 months for as long as you are taking PrEP. Pregnancy  If you are premenopausal and you may become pregnant, ask your health care provider about preconception counseling.  If you may become pregnant, take 400 to 800 micrograms (mcg) of folic acid every day.  If you want to prevent pregnancy, talk to your health care provider about birth control (contraception). Osteoporosis and menopause  Osteoporosis is a disease in which the bones lose minerals and strength with aging. This can result in serious bone fractures. Your risk for osteoporosis can be identified using a bone density scan.  If you are 67 years of age or older, or if you are at risk for osteoporosis and fractures, ask your health care provider if you should be screened.  Ask your health care provider whether you should take a calcium or vitamin D supplement to lower your risk for osteoporosis.  Menopause may have certain physical symptoms and risks.  Hormone replacement therapy may reduce some of these symptoms and risks. Talk to your health care provider about whether hormone replacement therapy is right for you. Follow these instructions at home:  Schedule regular health, dental, and eye exams.  Stay current with your immunizations.  Do not use any tobacco products including cigarettes, chewing  tobacco, or electronic cigarettes.  If you are pregnant, do not drink alcohol.  If you are breastfeeding, limit how much and how often you drink alcohol.  Limit alcohol intake to no more than 1 drink per day for nonpregnant women. One drink equals 12 ounces of beer, 5 ounces of wine, or 1 ounces of hard liquor.  Do not use street drugs.  Do not share needles.  Ask your health care provider for help if you need support or information about quitting drugs.  Tell your health care provider if you often feel depressed.  Tell your health care provider if you have ever been abused or do not feel safe at home. This information is not intended to replace advice given to you by your health care provider. Make sure you discuss any questions you have with your health care provider. Document Released: 07/05/2010 Document Revised: 05/28/2015 Document Reviewed: 09/23/2014 Elsevier Interactive Patient Education  [employer].

## 2017-05-02 ENCOUNTER — Telehealth: Payer: Self-pay | Admitting: Internal Medicine

## 2017-05-02 NOTE — Telephone Encounter (Signed)
Copied from CRM [id number]. Topic: Quick Communication - See Telephone Encounter >> May 02, 2017 11:42 AM Cipriano Bunker wrote: CRM for notification.   Pt. Is asking about the shingle shots. She does not think she had this before.   May need PA   See Telephone encounter for: 05/02/17.

## 2017-05-03 ENCOUNTER — Ambulatory Visit (INDEPENDENT_AMBULATORY_CARE_PROVIDER_SITE_OTHER): Payer: Federal, State, Local not specified - PPO | Admitting: Family Medicine

## 2017-05-03 DIAGNOSIS — Z23 Encounter for immunization: Secondary | ICD-10-CM | POA: Diagnosis not present

## 2017-05-03 NOTE — Telephone Encounter (Signed)
Patient's husband had called to clarify that vaccine is a 2 dose vaccine. He remembered getting Zostavax a few years ago and it was only one dose. Explained Shingrix is a new vaccine and dosing schedule to patient. No further needs at this time.

## 2017-05-22 ENCOUNTER — Encounter: Payer: Self-pay | Admitting: Internal Medicine

## 2017-05-22 ENCOUNTER — Ambulatory Visit: Payer: Federal, State, Local not specified - PPO | Admitting: Internal Medicine

## 2017-05-22 VITALS — BP 108/84 | HR 83 | Temp 98.2°F | Wt 210.2 lb

## 2017-05-22 DIAGNOSIS — Z79899 Other long term (current) drug therapy: Secondary | ICD-10-CM

## 2017-05-22 DIAGNOSIS — R635 Abnormal weight gain: Secondary | ICD-10-CM | POA: Diagnosis not present

## 2017-05-22 DIAGNOSIS — Z636 Dependent relative needing care at home: Secondary | ICD-10-CM | POA: Diagnosis not present

## 2017-05-22 MED ORDER — PHENTERMINE HCL 15 MG PO CAPS: 15.0000 mg | ORAL_CAPSULE | ORAL | 1 refills | Status: DC

## 2017-05-22 NOTE — Progress Notes (Signed)
Chief Complaint  Patient presents with  . Follow-up    1 month on weight loss and diet change. Discuss lab results.     HPI: Lindsay Parks 62 y.o. come in for  Lost 5 # and then   Relapsed and gained back . Using  [facility]  And lemon and ginger .   Less snack   Much caregiveer stress with  Home  Situation   .  husb was in reahb and now at home    Late night snack and be is her down time to be by herself and tends to eat pretzels other snacks   Sees counselor  Who is aware of husb and mom situatuation  ROS: See pertinent positives and negatives per HPI.  Past Medical History:  Diagnosis Date  . Allergy    SEASONAL  . Anxiety   . Arthritis   . Cataract   . Depression   . GERD (gastroesophageal reflux disease)    no longer since cholecystectomy  . History of esophageal stricture   . History of pancreatitis   . Hx of colonic polyps   . 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 Meningitis   . PONV (postoperative nausea and vomiting)   . UTI (lower urinary tract infection)     Family History  Problem Relation Age of Onset  . Stroke Mother   . Diabetes Mother   . Hypertension Mother   . Heart disease Mother   . Osteoporosis Mother   . Arthritis Mother   . Hyperlipidemia Mother   . Glaucoma Father   . Hypertension Father   . Gout Father   . Hyperlipidemia Father   . Cancer Father   . Colon cancer Paternal Grandmother        aunts and uncles  . Pancreatic cancer Paternal Grandfather        lung, and stomach  . Diabetes Other        grandson  . Heart disease Maternal Grandmother   . Kidney disease Paternal Uncle     Social History   Socioeconomic History  . Marital status: Married    Spouse name: Not on file  . Number of children: 2  . Years of education: Not on file  . Highest education level: Not on file  Occupational History  . Occupation: retired  Engineer, production  . Financial resource strain: Not on file  . Food insecurity:    Worry:  Not on file    Inability: Not on file  . Transportation needs:    Medical: Not on file    Non-medical: Not on file  Tobacco Use  . Smoking status: Never Smoker  . Smokeless tobacco: Never Used  Substance and Sexual Activity  . Alcohol use: Yes    Comment: socially  . Drug use: No  . Sexual activity: Not on file  Lifestyle  . Physical activity:    Days per week: Not on file    Minutes per session: Not on file  . Stress: Not on file  Relationships  . Social connections:    Talks on phone: Not on file    Gets together: Not on file    Attends religious service: Not on file    Active member of club or organization: Not on file    Attends meetings of clubs or organizations: Not on file    Relationship status: Not on file  Other Topics Concern  . Not on  file  Social History Narrative   Regular exercise- yes   Occupation: retired from [employer]   Married husband with parkinson's   2 children   care taking husband parkinson's  And  Elderly mom       Finished degree i    Outpatient Medications Prior to Visit  Medication Sig Dispense Refill  . calcium-vitamin D (OSCAL WITH D) 500-200 MG-UNIT per tablet Take 1 tablet by mouth daily.      . diphenhydrAMINE (BENADRYL) 25 MG tablet Take 25 mg by mouth every 8 (eight) hours as needed for allergies (allergy headaches.).     Marland Kitchen DULoxetine (CYMBALTA) 60 MG capsule Take 90 mg by mouth daily.     Marland Kitchen estradiol (VIVELLE-DOT) 0.075 MG/24HR Place 1 patch onto the skin 2 (two) times a week. Please schedule OV and labs for further refills. Thanks. 8 patch 11  . fluocinonide (LIDEX) 0.05 % cream Apply 1 application topically 2 (two) times daily as needed (for eczema).     . LORazepam (ATIVAN) 0.5 MG tablet Take 0.5 mg by mouth daily.  0   No facility-administered medications prior to visit.      EXAM:  BP 108/84 (BP Location: Right Arm, Patient Position: Sitting, Cuff Size: Normal)   Pulse 83   Temp 98.2 F (36.8 C) (Oral)   Wt 210 lb  3.2 oz (95.3 kg)   BMI 33.67 kg/m   Body mass index is 33.67 kg/m.  GENERAL: vitals reviewed and listed above, alert, oriented, appears well hydrated and in no acute distress HEENT: atraumatic, conjunctiva  clear, no obvious abnormalities on inspection of external nose and ears OP : no lesion edema or exudate  NECK: no obvious masses on inspection palpation  LUNGS: clear to auscultation bilaterally, no wheezes, rales or rhonchi, good air movement CV: HRRR, no clubbing cyanosis or  peripheral edema nl cap refill  MS: moves all extremities without noticeable focal  abnormality PSYCH: pleasant and cooperative, no obvious depression or anxiety Lab Results  Component Value Date   WBC 6.0 04/20/2017   HGB 13.3 04/20/2017   HCT 39.9 04/20/2017   PLT 256.0 04/20/2017   GLUCOSE 95 04/20/2017   CHOL 258 (H) 04/20/2017   TRIG 50.0 04/20/2017   HDL 69.70 04/20/2017   LDLDIRECT 149.3 06/11/2012   LDLCALC 178 (H) 04/20/2017   ALT 13 04/20/2017   AST 16 04/20/2017   NA 139 04/20/2017   K 4.7 04/20/2017   CL 103 04/20/2017   CREATININE 1.04 04/20/2017   BUN 13 04/20/2017   CO2 30 04/20/2017   TSH 1.30 04/20/2017   BP Readings from Last 3 Encounters:  05/22/17 108/84  04/20/17 124/78  03/17/17 (!) 151/90   Wt Readings from Last 3 Encounters:  05/22/17 210 lb 3.2 oz (95.3 kg)  04/20/17 207 lb 3.2 oz (94 kg)  03/16/17 209 lb (94.8 kg)     ASSESSMENT AND PLAN:  Discussed the following assessment and plan:  Weight gain  Medication management  Caregiver stress Disc options   And limitations of meds  Can try and  Continue to track  Continue counseling  RVO in 4-6 weeks or as needed.  -Patient advised to return or notify health care team  if  new concerns arise. Total visit > 50% spent counseling and coordinating care as indicated in above note and in instructions to patient .    Patient Instructions   Can try   Phentermine   For appetite suppression.   But  still  work  On the cues   .   As we discussed .       Wt Readings from Last 3 Encounters:  05/22/17 210 lb 3.2 oz (95.3 kg)  04/20/17 207 lb 3.2 oz (94 kg)  03/16/17 209 lb (94.8 kg)    Wanda K. Panosh M.D.

## 2017-05-22 NOTE — Patient Instructions (Signed)
Can try   Phentermine   For appetite suppression.   But still work  On the cues   .   As we discussed .       Wt Readings from Last 3 Encounters:  05/22/17 210 lb 3.2 oz (95.3 kg)  04/20/17 207 lb 3.2 oz (94 kg)  03/16/17 209 lb (94.8 kg)

## 2017-06-12 ENCOUNTER — Telehealth: Payer: Self-pay | Admitting: Internal Medicine

## 2017-06-12 NOTE — Telephone Encounter (Signed)
Called [facility] on [address] spoke  With  Marchelle Folksmanda  - patient has 11 refills  Patient  Notified

## 2017-06-12 NOTE — Telephone Encounter (Signed)
Copied from CRM [phone]. Topic: Quick Communication - See Telephone Encounter >> Jun 12, 2017  2:09 PM Waymon AmatoBurton, Donna F wrote: Pt is needing estradiol refilled   [facility] [address] [phone]  Best number [phone]

## 2017-06-22 NOTE — Progress Notes (Signed)
No chief complaint on file.   HPI: Lindsay Parks 62 y.o. come in for Chronic med  management   Med check  Phentermine 15   Trial   Gave her energyto get activity done and thus better eating      830 - 9 in am taking  businer   And  Working some  No sig se and sleep etc  nocv pulm sx   Feels help  ROS: See pertinent positives and negatives per HPI.  Past Medical History:  Diagnosis Date  . Allergy    SEASONAL  . Anxiety   . Arthritis   . Cataract   . Depression   . GERD (gastroesophageal reflux disease)    no longer since cholecystectomy  . History of esophageal stricture   . History of pancreatitis   . Hx of colonic polyps   . 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 Meningitis   . PONV (postoperative nausea and vomiting)   . UTI (lower urinary tract infection)     Family History  Problem Relation Age of Onset  . Stroke Mother   . Diabetes Mother   . Hypertension Mother   . Heart disease Mother   . Osteoporosis Mother   . Arthritis Mother   . Hyperlipidemia Mother   . Glaucoma Father   . Hypertension Father   . Gout Father   . Hyperlipidemia Father   . Cancer Father   . Colon cancer Paternal Grandmother        aunts and uncles  . Pancreatic cancer Paternal Grandfather        lung, and stomach  . Diabetes Other        grandson  . Heart disease Maternal Grandmother   . Kidney disease Paternal Uncle     Social History   Socioeconomic History  . Marital status: Married    Spouse name: Not on file  . Number of children: 2  . Years of education: Not on file  . Highest education level: Not on file  Occupational History  . Occupation: retired  Engineer, productionocial Needs  . Financial resource strain: Not on file  . Food insecurity:    Worry: Not on file    Inability: Not on file  . Transportation needs:    Medical: Not on file    Non-medical: Not on file  Tobacco Use  . Smoking status: Never Smoker  . Smokeless tobacco: Never Used  Substance  and Sexual Activity  . Alcohol use: Yes    Comment: socially  . Drug use: No  . Sexual activity: Not on file  Lifestyle  . Physical activity:    Days per week: Not on file    Minutes per session: Not on file  . Stress: Not on file  Relationships  . Social connections:    Talks on phone: Not on file    Gets together: Not on file    Attends religious service: Not on file    Active member of club or organization: Not on file    Attends meetings of clubs or organizations: Not on file    Relationship status: Not on file  Other Topics Concern  . Not on file  Social History Narrative   Regular exercise- yes   Occupation: retired from [employer] service   Married husband with parkinson's   2 children   care taking husband parkinson's  And  Elderly mom  Finished degree i    Outpatient Medications Prior to Visit  Medication Sig Dispense Refill  . calcium-vitamin D (OSCAL WITH D) 500-200 MG-UNIT per tablet Take 1 tablet by mouth daily.      . diphenhydrAMINE (BENADRYL) 25 MG tablet Take 25 mg by mouth every 8 (eight) hours as needed for allergies (allergy headaches.).     Marland Kitchen DULoxetine (CYMBALTA) 60 MG capsule Take 90 mg by mouth daily.     Marland Kitchen estradiol (VIVELLE-DOT) 0.075 MG/24HR Place 1 patch onto the skin 2 (two) times a week. Please schedule OV and labs for further refills. Thanks. 8 patch 11  . fluocinonide (LIDEX) 0.05 % cream Apply 1 application topically 2 (two) times daily as needed (for eczema).     . LORazepam (ATIVAN) 0.5 MG tablet Take 0.5 mg by mouth daily.  0  . phentermine 15 MG capsule Take 1 capsule (15 mg total) by mouth every morning. 30 capsule 1   No facility-administered medications prior to visit.      EXAM:  BP 122/81   Pulse 78   Temp 98.3 F (36.8 C)   Wt 205 lb (93 kg)   BMI 32.84 kg/m   Body mass index is 32.84 kg/m.  GENERAL: vitals reviewed and listed above, alert, oriented, appears well hydrated and in no acute distress HEENT: atraumatic,  conjunctiva  clear, no obvious abnormalities on inspection of external nose and ears  LUNGS: clear to auscultation bilaterally, no wheezes, rales or rhonchi, good air movement CV: HRRR, no clubbing cyanosis or  peripheral edema nl cap refill   PSYCH: pleasant and cooperative, no obvious depression or anxiety  BP Readings from Last 3 Encounters:  06/23/17 122/81  05/22/17 108/84  04/20/17 124/78   Wt Readings from Last 3 Encounters:  06/23/17 205 lb (93 kg)  05/22/17 210 lb 3.2 oz (95.3 kg)  04/20/17 207 lb 3.2 oz (94 kg)    ASSESSMENT AND PLAN:  Discussed the following assessment and plan:  WEIGHT GAIN, ABNORMAL  Medication management - 5 # weight loss in one month contniue  BMI 32.0-32.9,adult Benefit more than risk of medication  to continue. Call for refill sane dose  ROV in 2 mos  Counseled. Dietary changes  In addition next step -Patient advised to return or notify health care team  if  new concerns arise.  Patient Instructions  Continue meds .   Continue working on snacks    Contact us when need refill of med   ROV in 2 months     Lindsay Parks M.D.

## 2017-06-23 ENCOUNTER — Ambulatory Visit: Payer: Federal, State, Local not specified - PPO | Admitting: Internal Medicine

## 2017-06-23 ENCOUNTER — Encounter: Payer: Self-pay | Admitting: Internal Medicine

## 2017-06-23 VITALS — BP 122/81 | HR 78 | Temp 98.3°F | Wt 205.0 lb

## 2017-06-23 DIAGNOSIS — R635 Abnormal weight gain: Secondary | ICD-10-CM | POA: Diagnosis not present

## 2017-06-23 DIAGNOSIS — Z6832 Body mass index (BMI) 32.0-32.9, adult: Secondary | ICD-10-CM | POA: Diagnosis not present

## 2017-06-23 DIAGNOSIS — Z79899 Other long term (current) drug therapy: Secondary | ICD-10-CM

## 2017-06-23 NOTE — Patient Instructions (Signed)
Continue meds .   Continue working on snacks    Contact us when need refill of med   ROV in 2 months

## 2017-07-03 ENCOUNTER — Other Ambulatory Visit: Payer: Self-pay | Admitting: Internal Medicine

## 2017-07-05 ENCOUNTER — Other Ambulatory Visit: Payer: Federal, State, Local not specified - PPO

## 2017-07-12 ENCOUNTER — Ambulatory Visit (INDEPENDENT_AMBULATORY_CARE_PROVIDER_SITE_OTHER): Payer: Federal, State, Local not specified - PPO | Admitting: *Deleted

## 2017-07-12 DIAGNOSIS — Z23 Encounter for immunization: Secondary | ICD-10-CM | POA: Diagnosis not present

## 2017-07-12 NOTE — Progress Notes (Signed)
Per orders of Dr. Salomon FickBanks, injection of Shingrix given by Nadara EatonAshtyn Green, CMA. Patient tolerated injection well.

## 2017-07-17 ENCOUNTER — Other Ambulatory Visit: Payer: Self-pay | Admitting: Internal Medicine

## 2017-07-19 NOTE — Telephone Encounter (Signed)
Sent in electronically .  

## 2017-08-23 ENCOUNTER — Encounter: Payer: Self-pay | Admitting: Internal Medicine

## 2017-08-23 ENCOUNTER — Ambulatory Visit (INDEPENDENT_AMBULATORY_CARE_PROVIDER_SITE_OTHER): Payer: Federal, State, Local not specified - PPO | Admitting: Internal Medicine

## 2017-08-23 VITALS — BP 112/64 | HR 68 | Temp 98.1°F | Wt 201.8 lb

## 2017-08-23 DIAGNOSIS — Z23 Encounter for immunization: Secondary | ICD-10-CM | POA: Diagnosis not present

## 2017-08-23 DIAGNOSIS — Z7689 Persons encountering health services in other specified circumstances: Secondary | ICD-10-CM

## 2017-08-23 DIAGNOSIS — Z79899 Other long term (current) drug therapy: Secondary | ICD-10-CM

## 2017-08-23 MED ORDER — PHENTERMINE HCL 15 MG PO CAPS: ORAL_CAPSULE | ORAL | 2 refills | Status: DC

## 2017-08-23 NOTE — Patient Instructions (Addendum)
Continue lifestyle intervention healthy eating and exercise .  Refill meds  ROV before  running out.    In 2-3 mos.

## 2017-08-23 NOTE — Progress Notes (Signed)
Chief Complaint  Patient presents with  . Follow-up    Pt reports weight 198lb at home this morning. Pt has been doing well with lifestyle changes - patient has been working on her snacking. Pt has been more mobile and keeping busy    HPI: Cecelia ByarsHazel G Moosman 62 y.o. come in for Chronic disease management   Fu  wegiht management and  Fu   Feels well on this does   Another 5 # weight loss so  far and helps her   With snacking  Plans on optimizing sleep  Would l ike to stay on same medication.   Husbands care sleep  patterns sometime interrupted  ROS: See pertinent positives and negatives per HPI. No new cv pulm sx   Past Medical History:  Diagnosis Date  . Allergy    SEASONAL  . Anxiety   . Arthritis   . Cataract   . Depression   . GERD (gastroesophageal reflux disease)    no longer since cholecystectomy  . History of esophageal stricture   . History of pancreatitis   . Hx of colonic polyps   . 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 Meningitis   . PONV (postoperative nausea and vomiting)   . UTI (lower urinary tract infection)     Family History  Problem Relation Age of Onset  . Stroke Mother   . Diabetes Mother   . Hypertension Mother   . Heart disease Mother   . Osteoporosis Mother   . Arthritis Mother   . Hyperlipidemia Mother   . Glaucoma Father   . Hypertension Father   . Gout Father   . Hyperlipidemia Father   . Cancer Father   . Colon cancer Paternal Grandmother        aunts and uncles  . Pancreatic cancer Paternal Grandfather        lung, and stomach  . Diabetes Other        grandson  . Heart disease Maternal Grandmother   . Kidney disease Paternal Uncle     Social History   Socioeconomic History  . Marital status: Married    Spouse name: Not on file  . Number of children: 2  . Years of education: Not on file  . Highest education level: Not on file  Occupational History  . Occupation: retired  Engineer, productionocial Needs  . Financial  resource strain: Not on file  . Food insecurity:    Worry: Not on file    Inability: Not on file  . Transportation needs:    Medical: Not on file    Non-medical: Not on file  Tobacco Use  . Smoking status: Never Smoker  . Smokeless tobacco: Never Used  Substance and Sexual Activity  . Alcohol use: Yes    Comment: socially  . Drug use: No  . Sexual activity: Not on file  Lifestyle  . Physical activity:    Days per week: Not on file    Minutes per session: Not on file  . Stress: Not on file  Relationships  . Social connections:    Talks on phone: Not on file    Gets together: Not on file    Attends religious service: Not on file    Active member of club or organization: Not on file    Attends meetings of clubs or organizations: Not on file    Relationship status: Not on file  Other Topics Concern  . Not  on file  Social History Narrative   Regular exercise- yes   Occupation: retired from [employer]   Married husband with parkinson's   2 children   care taking husband parkinson's  And  Elderly mom       Finished degree i    Outpatient Medications Prior to Visit  Medication Sig Dispense Refill  . calcium-vitamin D (OSCAL WITH D) 500-200 MG-UNIT per tablet Take 1 tablet by mouth daily.      . diphenhydrAMINE (BENADRYL) 25 MG tablet Take 25 mg by mouth every 8 (eight) hours as needed for allergies (allergy headaches.).     Marland Kitchen. DULoxetine (CYMBALTA) 60 MG capsule Take 90 mg by mouth daily.     Marland Kitchen. estradiol (VIVELLE-DOT) 0.075 MG/24HR Place 1 patch onto the skin 2 times a week 24 patch 0  . fluocinonide (LIDEX) 0.05 % cream Apply 1 application topically 2 (two) times daily as needed (for eczema).     . LORazepam (ATIVAN) 0.5 MG tablet Take 0.5 mg by mouth daily.  0  . phentermine 15 MG capsule TAKE 1 CAPSULE BY MOUTH EVERY DAY IN THE MORNING 30 capsule 1   No facility-administered medications prior to visit.      EXAM:  BP 112/64 (BP Location: Right Arm, Patient  Position: Sitting, Cuff Size: Normal)   Pulse 68   Temp 98.1 F (36.7 C) (Oral)   Wt 201 lb 12.8 oz (91.5 kg)   BMI 32.33 kg/m   Body mass index is 32.33 kg/m.  GENERAL: vitals reviewed and listed above, alert, oriented, appears well hydrated and in no acute distress PSYCH: pleasant and cooperative, no obvious depression or anxiety Lab Results  Component Value Date   WBC 6.0 04/20/2017   HGB 13.3 04/20/2017   HCT 39.9 04/20/2017   PLT 256.0 04/20/2017   GLUCOSE 95 04/20/2017   CHOL 258 (H) 04/20/2017   TRIG 50.0 04/20/2017   HDL 69.70 04/20/2017   LDLDIRECT 149.3 06/11/2012   LDLCALC 178 (H) 04/20/2017   ALT 13 04/20/2017   AST 16 04/20/2017   NA 139 04/20/2017   K 4.7 04/20/2017   CL 103 04/20/2017   CREATININE 1.04 04/20/2017   BUN 13 04/20/2017   CO2 30 04/20/2017   TSH 1.30 04/20/2017   BP Readings from Last 3 Encounters:  08/23/17 112/64  06/23/17 122/81  05/22/17 108/84    ASSESSMENT AND PLAN:  Discussed the following assessment and plan:  Encounter for weight management  Medication management continue medication       Expectant management. Disc increase dose but patient sees as going well at present . Working on sleep.  ROV in 2-3 months or as needed  Flu vaccine today readdress lipids in future  ascvd risk is 4.3  -Patient advised to return or notify health care team  if  new concerns arise. Total visit 15mins > 50% spent counseling and coordinating care as indicated in above note and in instructions to patient .  Benefit more than risk of medications  to continue.   Patient Instructions  Continue lifestyle intervention healthy eating and exercise .  Refill meds  ROV before  running out.    In 2-3 mos.     Neta MendsWanda K. Jeniah Kishi M.D.

## 2017-10-14 ENCOUNTER — Other Ambulatory Visit: Payer: Self-pay | Admitting: Internal Medicine

## 2017-10-18 ENCOUNTER — Other Ambulatory Visit: Payer: Self-pay | Admitting: Internal Medicine

## 2017-10-18 NOTE — Telephone Encounter (Signed)
Please advise Dr Panosh, thanks.   

## 2017-10-18 NOTE — Telephone Encounter (Signed)
Requested medication (s) are due for refill today: yes  Requested medication (s) are on the active medication list: no  Last refill:  03/17/17  Future visit scheduled: no  Notes to clinic:  expired    Requested Prescriptions  Pending Prescriptions Disp Refills   cephALEXin (KEFLEX) 500 MG capsule 28 capsule 0    Sig: Take 1 capsule (500 mg total) by mouth 4 (four) times daily.     Off-Protocol Failed - 10/18/2017  4:02 PM      Failed - Medication not assigned to a protocol, review manually.      Passed - Valid encounter within last 12 months    Recent Outpatient Visits          1 month ago Encounter for [employer] [facility], Neta Mends, MD   3 months ago WEIGHT GAIN, ABNORMAL   [facility], Neta Mends, MD   4 months ago Weight gain   [facility], Neta Mends, MD   6 months ago Visit for preventive health examination   [facility], Neta Mends, MD   2 years ago Encounter for preventative adult health care exam with abnormal findings   [facility], Neta Mends, MD

## 2017-10-18 NOTE — Telephone Encounter (Signed)
Copied from CRM [id number]. Topic: Quick Communication - Rx Refill/Question >> Oct 18, 2017  3:45 PM Mickel Baas B, NT wrote: **States that she is going to cut her toe nails and always had to take an antibiotic incase she knicks herself.**  Medication: cephALEXin (KEFLEX) 500 MG capsule  Has the patient contacted their pharmacy? Yes.   (Agent: If no, request that the patient contact the pharmacy for the refill.) (Agent: If yes, when and what did the pharmacy advise?)  Preferred Pharmacy (with phone number or street name): [id number] - [place], [place] - [address]  Agent: Please be advised that RX refills may take up to 3 business days. We ask that you follow-up with your pharmacy.

## 2017-10-19 MED ORDER — CEPHALEXIN 500 MG PO CAPS: 500.0000 mg | ORAL_CAPSULE | Freq: Four times a day (QID) | ORAL | 0 refills | Status: DC

## 2017-10-19 NOTE — Telephone Encounter (Signed)
Sent in electronically . Muses for as needed skin infection with her lyphedema

## 2017-11-23 LAB — HM MAMMOGRAPHY

## 2017-12-26 ENCOUNTER — Encounter: Payer: Self-pay | Admitting: Internal Medicine

## 2018-01-09 ENCOUNTER — Other Ambulatory Visit: Payer: Self-pay | Admitting: Internal Medicine

## 2018-01-16 ENCOUNTER — Other Ambulatory Visit: Payer: Self-pay | Admitting: Internal Medicine

## 2018-01-16 NOTE — Telephone Encounter (Signed)
Over due for ov for med check  I will send this in  But tell her to get OV before  Further refills  thanks

## 2018-01-16 NOTE — Telephone Encounter (Signed)
Last OV:08/23/17 Last filled:08/23/17 Please advise

## 2018-01-16 NOTE — Telephone Encounter (Signed)
Pt has been notified and scheduled for 2/5 @ 2:15

## 2018-02-06 NOTE — Progress Notes (Signed)
Chief Complaint  Patient presents with  . Follow-up    Pt needs refill on phentermine and feels it helps but would like to increase does.Pt is having numbness on finger tips happens more in the mornings and late at night. Talked with ortho and was prescribed gabepentin states that it helped     HPI: Lindsay Parks 63 y.o. come in for    Med check  Phentermine helps  But other family issues intervening   Helping unless stays of late with mom care tends to snack if stays  up late  Weight had been down to 1986  No sig se of meds  Also care retaking husband dx PNA.  ROS: See pertinent positives and negatives per HPI. Tingling in all fingers feels like has a glove on  No pain  No radiation from neck but has hx c spine surgery . No weakness   nohx cts  No sig sx feet as in hands   Past Medical History:  Diagnosis Date  . Allergy    SEASONAL  . Anxiety   . Arthritis   . Cataract   . Depression   . GERD (gastroesophageal reflux disease)    no longer since cholecystectomy  . History of esophageal stricture   . History of pancreatitis   . Hx of colonic polyps   . 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 Meningitis   . PONV (postoperative nausea and vomiting)   . UTI (lower urinary tract infection)     Family History  Problem Relation Age of Onset  . Stroke Mother   . Diabetes Mother   . Hypertension Mother   . Heart disease Mother   . Osteoporosis Mother   . Arthritis Mother   . Hyperlipidemia Mother   . Glaucoma Father   . Hypertension Father   . Gout Father   . Hyperlipidemia Father   . Cancer Father   . Colon cancer Paternal Grandmother        aunts and uncles  . Pancreatic cancer Paternal Grandfather        lung, and stomach  . Diabetes Other        grandson  . Heart disease Maternal Grandmother   . Kidney disease Paternal Uncle     Social History   Socioeconomic History  . Marital status: Married    Spouse name: Not on file  . Number of  children: 2  . Years of education: Not on file  . Highest education level: Not on file  Occupational History  . Occupation: retired  Engineer, productionocial Needs  . Financial resource strain: Not on file  . Food insecurity:    Worry: Not on file    Inability: Not on file  . Transportation needs:    Medical: Not on file    Non-medical: Not on file  Tobacco Use  . Smoking status: Never Smoker  . Smokeless tobacco: Never Used  Substance and Sexual Activity  . Alcohol use: Yes    Comment: socially  . Drug use: No  . Sexual activity: Not on file  Lifestyle  . Physical activity:    Days per week: Not on file    Minutes per session: Not on file  . Stress: Not on file  Relationships  . Social connections:    Talks on phone: Not on file    Gets together: Not on file    Attends religious service: Not on file  Active member of club or organization: Not on file    Attends meetings of clubs or organizations: Not on file    Relationship status: Not on file  Other Topics Concern  . Not on file  Social History Narrative   Regular exercise- yes   Occupation: retired from [employer]   Married husband with parkinson's   2 children   care taking husband parkinson's  And  Elderly mom       Finished degree i    Outpatient Medications Prior to Visit  Medication Sig Dispense Refill  . calcium-vitamin D (OSCAL WITH D) 500-200 MG-UNIT per tablet Take 1 tablet by mouth daily.      . cephALEXin (KEFLEX) 500 MG capsule Take 1 capsule (500 mg total) by mouth 4 (four) times daily. As needed for skin infection 28 capsule 0  . diphenhydrAMINE (BENADRYL) 25 MG tablet Take 25 mg by mouth every 8 (eight) hours as needed for allergies (allergy headaches.).     Marland Kitchen DULoxetine (CYMBALTA) 60 MG capsule Take 90 mg by mouth daily.     Marland Kitchen estradiol (VIVELLE-DOT) 0.075 MG/24HR PLACE 1 PATCH ONTO THE SKIN 2 TIMES A WEEK 24 patch 1  . fluocinonide (LIDEX) 0.05 % cream Apply 1 application topically 2 (two) times daily as  needed (for eczema).     . LORazepam (ATIVAN) 0.5 MG tablet Take 0.5 mg by mouth daily.  0  . phentermine 15 MG capsule TAKE 1 CAPSULE BY MOUTH EVERY DAY IN THE MORNING 30 capsule 0  . gabapentin (NEURONTIN) 100 MG capsule      No facility-administered medications prior to visit.      EXAM:  BP 114/72 (BP Location: Right Arm, Patient Position: Sitting, Cuff Size: Normal)   Pulse 80   Temp 97.9 F (36.6 C) (Oral)   Wt 205 lb 12.8 oz (93.4 kg)   BMI 32.97 kg/m   Body mass index is 32.97 kg/m.  GENERAL: vitals reviewed and listed above, alert, oriented, appears well hydrated and in no acute distress HEENT: atraumatic, conjunctiva  clear, no obvious abnormalities on inspection of external nose and ears NECK: no obvious masses on inspection palpation  MS: moves all extremities without noticeable focal  Abnormality hands no lesion no atropphy noted  Nl pulses nl color  PSYCH: pleasant and cooperative, no obvious depression or anxiety Lab Results  Component Value Date   WBC 6.0 04/20/2017   HGB 13.3 04/20/2017   HCT 39.9 04/20/2017   PLT 256.0 04/20/2017   GLUCOSE 95 04/20/2017   CHOL 258 (H) 04/20/2017   TRIG 50.0 04/20/2017   HDL 69.70 04/20/2017   LDLDIRECT 149.3 06/11/2012   LDLCALC 178 (H) 04/20/2017   ALT 13 04/20/2017   AST 16 04/20/2017   NA 139 04/20/2017   K 4.7 04/20/2017   CL 103 04/20/2017   CREATININE 1.04 04/20/2017   BUN 13 04/20/2017   CO2 30 04/20/2017   TSH 1.30 04/20/2017   BP Readings from Last 3 Encounters:  02/07/18 114/72  08/23/17 112/64  06/23/17 122/81   Wt Readings from Last 3 Encounters:  02/07/18 205 lb 12.8 oz (93.4 kg)  08/23/17 201 lb 12.8 oz (91.5 kg)  06/23/17 205 lb (93 kg)     ASSESSMENT AND PLAN:  Discussed the following assessment and plan:  Obesity (BMI 30-39.9)  Medication management  Hx of cervical spine surgery  Numbness and tingling in both fingers hands  Stress due to illness of family member Was down to  196  And  Then up from home issues  Feels it helps   A good bit but   Home life  Should improve  Consider inc dose but she will track and stay on same dose  Plan cpx in April may and labs at that time  tingling in hnads  Not part feet  ? Local neuropathy hx of  c spine surgery  Not typicla  Consider   Seeing neurologist  .  Or brooks . Let us know if we need to refer.   Total visit 25mins > 50% spent counseling and coordinating care as indicated in above note and in instructions to patient .    -Patient advised to return or notify health care team  if  new concerns arise.  Patient Instructions    consdier seeing  Dr Shon BatonBrooks or  Neurology about the hand tingling  Not sure if  Related to neck or poss CTS  Other.   Ok to continue the same [employer] and consider inc dose after tracking.   cpx  rov in 3 months  Med check .   Neta MendsWanda K. Nami Strawder M.D.

## 2018-02-07 ENCOUNTER — Ambulatory Visit: Payer: Federal, State, Local not specified - PPO | Admitting: Internal Medicine

## 2018-02-07 ENCOUNTER — Encounter: Payer: Self-pay | Admitting: Internal Medicine

## 2018-02-07 VITALS — BP 114/72 | HR 80 | Temp 97.9°F | Wt 205.8 lb

## 2018-02-07 DIAGNOSIS — Z79899 Other long term (current) drug therapy: Secondary | ICD-10-CM

## 2018-02-07 DIAGNOSIS — R200 Anesthesia of skin: Secondary | ICD-10-CM | POA: Diagnosis not present

## 2018-02-07 DIAGNOSIS — E669 Obesity, unspecified: Secondary | ICD-10-CM | POA: Diagnosis not present

## 2018-02-07 DIAGNOSIS — Z9889 Other specified postprocedural states: Secondary | ICD-10-CM | POA: Diagnosis not present

## 2018-02-07 DIAGNOSIS — Z6379 Other stressful life events affecting family and household: Secondary | ICD-10-CM

## 2018-02-07 DIAGNOSIS — R202 Paresthesia of skin: Secondary | ICD-10-CM

## 2018-02-07 MED ORDER — PHENTERMINE HCL 15 MG PO CAPS: ORAL_CAPSULE | ORAL | 0 refills | Status: DC

## 2018-02-07 NOTE — Patient Instructions (Addendum)
   consdier seeing  Dr Shon BatonBrooks or  Neurology about the hand tingling  Not sure if  Related to neck or poss CTS  Other.   Ok to continue the same [employer] and consider inc dose after tracking.   cpx  rov in 3 months  Med check .

## 2018-04-23 NOTE — Progress Notes (Signed)
Virtual Visit via Video Note  I connected with@ on 04/24/18 at 10:30 AM EDT by a video enabled telemedicine application and verified that I am speaking with the correct person using two identifiers. Location patient: home Location provider: home office Persons participating in the virtual visit: patient, provider  [place] national recommendations  regarding COVID 19 pandemic   video visit is advised over in office visit for this patient.  Discussed the limitations of evaluation and management by telemedicine and  availability of in person appointments. The patient expressed understanding and agreed to proceed.   HPI: Lindsay Parks  Presents for video visit  On phentermine and seems to help keep her appetite from over eating . Husband goingt o Assisted living and in transition and mom has moved back out.   Nose  But readress hand tingling nimb cpomes and oges both hands more dominant   . No weakenss per se  Is s/p  c spine surgery and will need  c2 other in future  No new weakness    ROS: See pertinent positives and negatives per HPI. No cp sob new sx energy level ok cahoti schedule   Past Medical History:  Diagnosis Date  . Allergy    SEASONAL  . Anxiety   . Arthritis   . Cataract   . Depression   . GERD (gastroesophageal reflux disease)    no longer since cholecystectomy  . History of esophageal stricture   . History of pancreatitis   . Hx of colonic polyps   . 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 Meningitis   . PONV (postoperative nausea and vomiting)   . UTI (lower urinary tract infection)     Past Surgical History:  Procedure Laterality Date  . ANTERIOR CERVICAL DECOMP/DISCECTOMY FUSION N/A 03/16/2017   Procedure: ANTERIOR CERVICAL DISCECTOMY AND FUSION FOR DECOMPRESSION ONE  LEVEL CERVICAL SIX-SEVEN  RIGHT SIDED APPROACH;  Surgeon: Venita Lick, MD;  Location: [facility] OR;  Service: Orthopedics;  Laterality: N/A;  . CERVICAL DISCECTOMY     c5 and c6   . CHOLECYSTECTOMY    . COLONOSCOPY    . ELBOW SURGERY    . ESOPHAGOGASTRODUODENOSCOPY    . FLEXIBLE SIGMOIDOSCOPY    . PARTIAL HYSTERECTOMY  2005   left hydrosalpinx ovarian cyst  . SHOULDER SURGERY    . TOTAL ABDOMINAL HYSTERECTOMY      Family History  Problem Relation Age of Onset  . Stroke Mother   . Diabetes Mother   . Hypertension Mother   . Heart disease Mother   . Osteoporosis Mother   . Arthritis Mother   . Hyperlipidemia Mother   . Glaucoma Father   . Hypertension Father   . Gout Father   . Hyperlipidemia Father   . Cancer Father   . Colon cancer Paternal Grandmother        aunts and uncles  . Pancreatic cancer Paternal Grandfather        lung, and stomach  . Diabetes Other        grandson  . Heart disease Maternal Grandmother   . Kidney disease Paternal Uncle     Social History   Tobacco Use  . Smoking status: Never Smoker  . Smokeless tobacco: Never Used  Substance Use Topics  . Alcohol use: Yes    Comment: socially  . Drug use: No      Current Outpatient Medications:  .  calcium-vitamin D (OSCAL WITH D) 500-200 MG-UNIT  per tablet, Take 1 tablet by mouth daily.  , Disp: , Rfl:  .  cephALEXin (KEFLEX) 500 MG capsule, Take 1 capsule (500 mg total) by mouth 4 (four) times daily. As needed for skin infection, Disp: 28 capsule, Rfl: 0 .  diphenhydrAMINE (BENADRYL) 25 MG tablet, Take 25 mg by mouth every 8 (eight) hours as needed for allergies (allergy headaches.). , Disp: , Rfl:  .  DULoxetine (CYMBALTA) 60 MG capsule, Take 90 mg by mouth daily. , Disp: , Rfl:  .  estradiol (VIVELLE-DOT) 0.075 MG/24HR, PLACE 1 PATCH ONTO THE SKIN 2 TIMES A WEEK, Disp: 24 patch, Rfl: 1 .  fluocinonide (LIDEX) 0.05 % cream, Apply 1 application topically 2 (two) times daily as needed (for eczema). , Disp: , Rfl:  .  gabapentin (NEURONTIN) 100 MG capsule, , Disp: , Rfl:  .  LORazepam (ATIVAN) 0.5 MG tablet, Take 0.5 mg by mouth daily., Disp: , Rfl: 0 .  phentermine 30 MG  capsule, Take 1 capsule (30 mg total) by mouth every morning., Disp: 30 capsule, Rfl: 1  EXAM: BP Readings from Last 3 Encounters:  02/07/18 114/72  08/23/17 112/64  06/23/17 122/81   Wt Readings from Last 3 Encounters:  02/07/18 205 lb 12.8 oz (93.4 kg)  08/23/17 201 lb 12.8 oz (91.5 kg)  06/23/17 205 lb (93 kg)    VITALS per patient if applicable: reported as 202    And bp  Was 123/88  In a rush during visit  Pulse 94  ( had to go look for cuff and resports lower readings usually)   GENERAL: alert, oriented, appears well and in no acute distress  HEENT: atraumatic, conjunttiva clear, no obvious abnormalities on inspection of external nose and ears  NECK: normal movements of the head and neck  LUNGS: on inspection no signs of respiratory distress, breathing rate appears normal, no obvious gross SOB, gasping or wheezing  CV: no obvious cyanosis  MS: moves all visible extremities without noticeable abnormality  PSYCH/NEURO: pleasant and cooperative, no obvious depression or anxiety, speech and thought processing grossly intact Lab Results  Component Value Date   WBC 6.0 04/20/2017   HGB 13.3 04/20/2017   HCT 39.9 04/20/2017   PLT 256.0 04/20/2017   GLUCOSE 95 04/20/2017   CHOL 258 (H) 04/20/2017   TRIG 50.0 04/20/2017   HDL 69.70 04/20/2017   LDLDIRECT 149.3 06/11/2012   LDLCALC 178 (H) 04/20/2017   ALT 13 04/20/2017   AST 16 04/20/2017   NA 139 04/20/2017   K 4.7 04/20/2017   CL 103 04/20/2017   CREATININE 1.04 04/20/2017   BUN 13 04/20/2017   CO2 30 04/20/2017   TSH 1.30 04/20/2017    ASSESSMENT AND PLAN:  Discussed the following assessment and plan:  Encounter for weight management - Plan: Basic metabolic panel, CBC with Differential/Platelet, Hepatic function panel, Lipid panel, TSH, Hemoglobin A1c  Medication management - Plan: Basic metabolic panel, CBC with Differential/Platelet, Hepatic function panel, Lipid panel, TSH, Hemoglobin A1c  Obesity (BMI  30-39.9) - Plan: Basic metabolic panel, CBC with Differential/Platelet, Hepatic function panel, Lipid panel, TSH, Hemoglobin A1c  Bilateral hand numbness - poss  cts cw median nerve by scrip  less leikely cerical  - Plan: Basic metabolic panel, CBC with Differential/Platelet, Hepatic function panel, Lipid panel, TSH, Hemoglobin A1c  Hx of cervical spine surgery - Plan: Basic metabolic panel, CBC with Differential/Platelet, Hepatic function panel, Lipid panel, TSH, Hemoglobin A1c  MILROY'S DISEASE - Plan: Basic metabolic panel, CBC  with Differential/Platelet, Hepatic function panel, Lipid panel, TSH, Hemoglobin A1c  Numbness and tingling in both fingers hands - Plan: Basic metabolic panel, CBC with Differential/Platelet, Hepatic function panel, Lipid panel, TSH, Hemoglobin A1c  Hyperlipidemia, unspecified hyperlipidemia type - Plan: Basic metabolic panel, CBC with Differential/Platelet, Hepatic function panel, Lipid panel, TSH, Hemoglobin A1c  Counseled.  Benefit more than risk of medications  to continue. Inc dose to 30 mg per day for now  And rov vv in 6-8 weeks or earlier as needed   Expectant management and discussion of plan and treatment with patient with opportunity to ask questions and all were answered. The patient agreed with the plan and demonstrated an understanding of the instructions.  due for lab  cpx in summer and lab    Future orders placed  The patient was advised to call back  if worsening  or having concerns . In interim     Berniece Andreas, MD

## 2018-04-24 ENCOUNTER — Encounter: Payer: Self-pay | Admitting: Internal Medicine

## 2018-04-24 ENCOUNTER — Ambulatory Visit (INDEPENDENT_AMBULATORY_CARE_PROVIDER_SITE_OTHER): Payer: Federal, State, Local not specified - PPO | Admitting: Internal Medicine

## 2018-04-24 ENCOUNTER — Other Ambulatory Visit: Payer: Self-pay

## 2018-04-24 DIAGNOSIS — R200 Anesthesia of skin: Secondary | ICD-10-CM | POA: Diagnosis not present

## 2018-04-24 DIAGNOSIS — Z79899 Other long term (current) drug therapy: Secondary | ICD-10-CM

## 2018-04-24 DIAGNOSIS — E669 Obesity, unspecified: Secondary | ICD-10-CM | POA: Diagnosis not present

## 2018-04-24 DIAGNOSIS — E785 Hyperlipidemia, unspecified: Secondary | ICD-10-CM

## 2018-04-24 DIAGNOSIS — Z7689 Persons encountering health services in other specified circumstances: Secondary | ICD-10-CM

## 2018-04-24 DIAGNOSIS — Z9889 Other specified postprocedural states: Secondary | ICD-10-CM

## 2018-04-24 DIAGNOSIS — R202 Paresthesia of skin: Secondary | ICD-10-CM

## 2018-04-24 DIAGNOSIS — Q820 Hereditary lymphedema: Secondary | ICD-10-CM

## 2018-04-24 MED ORDER — PHENTERMINE HCL 30 MG PO CAPS: 30.0000 mg | ORAL_CAPSULE | ORAL | 1 refills | Status: DC

## 2018-07-02 ENCOUNTER — Encounter: Payer: Self-pay | Admitting: Internal Medicine

## 2018-07-02 ENCOUNTER — Other Ambulatory Visit: Payer: Self-pay

## 2018-07-02 ENCOUNTER — Ambulatory Visit (INDEPENDENT_AMBULATORY_CARE_PROVIDER_SITE_OTHER): Payer: Federal, State, Local not specified - PPO | Admitting: Internal Medicine

## 2018-07-02 DIAGNOSIS — Q820 Hereditary lymphedema: Secondary | ICD-10-CM

## 2018-07-02 DIAGNOSIS — I890 Lymphedema, not elsewhere classified: Secondary | ICD-10-CM | POA: Diagnosis not present

## 2018-07-02 MED ORDER — CEPHALEXIN 500 MG PO CAPS: 500.0000 mg | ORAL_CAPSULE | Freq: Four times a day (QID) | ORAL | 0 refills | Status: DC

## 2018-07-02 NOTE — Progress Notes (Signed)
Virtual Visit via Video Note  I connected with@ on 07/02/18 at  3:30 PM EDT by a video enabled telemedicine application and verified that I am speaking with the correct person using two identifiers. Location patient: home Location provider:work or home office Persons participating in the virtual visit: patient, provider  [place] national recommendations  regarding COVID 19 pandemic   video visit is advised over in office visit for this patient.  Patient aware  of the limitations of evaluation and management by telemedicine and  availability of in person appointments. and agreed to proceed.   HPI: Lindsay Parks presents for video visit  SDA   Left foot having water weeping from area off an on for days  At the point  she had venogram when she was  13  No pain no redness fever or systemic sx  Area is clear   Had been using stockings  And up and around more than usual   Husband just went in to assisted living( [facility])     just began wrapping  Leg  No pain with this  ( has left lymphedema  Milroys ) No resp systemic sx   ROS: See pertinent positives and negatives per HPI.  Past Medical History:  Diagnosis Date  . Allergy    SEASONAL  . Anxiety   . Arthritis   . Cataract   . Depression   . GERD (gastroesophageal reflux disease)    no longer since cholecystectomy  . History of esophageal stricture   . History of pancreatitis   . Hx of colonic polyps   . 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 Meningitis   . PONV (postoperative nausea and vomiting)   . UTI (lower urinary tract infection)     Past Surgical History:  Procedure Laterality Date  . ANTERIOR CERVICAL DECOMP/DISCECTOMY FUSION N/A 03/16/2017   Procedure: ANTERIOR CERVICAL DISCECTOMY AND FUSION FOR DECOMPRESSION ONE  LEVEL CERVICAL SIX-SEVEN  RIGHT SIDED APPROACH;  Surgeon: Venita LickBrooks, Dahari, MD;  Location: [facility] OR;  Service: Orthopedics;  Laterality: N/A;  . CERVICAL DISCECTOMY     c5 and c6  .  CHOLECYSTECTOMY    . COLONOSCOPY    . ELBOW SURGERY    . ESOPHAGOGASTRODUODENOSCOPY    . FLEXIBLE SIGMOIDOSCOPY    . PARTIAL HYSTERECTOMY  2005   left hydrosalpinx ovarian cyst  . SHOULDER SURGERY    . TOTAL ABDOMINAL HYSTERECTOMY      Family History  Problem Relation Age of Onset  . Stroke Mother   . Diabetes Mother   . Hypertension Mother   . Heart disease Mother   . Osteoporosis Mother   . Arthritis Mother   . Hyperlipidemia Mother   . Glaucoma Father   . Hypertension Father   . Gout Father   . Hyperlipidemia Father   . Cancer Father   . Colon cancer Paternal Grandmother        aunts and uncles  . Pancreatic cancer Paternal Grandfather        lung, and stomach  . Diabetes Other        grandson  . Heart disease Maternal Grandmother   . Kidney disease Paternal Uncle     Social History   Tobacco Use  . Smoking status: Never Smoker  . Smokeless tobacco: Never Used  Substance Use Topics  . Alcohol use: Yes    Comment: socially  . Drug use: No      Current Outpatient Medications:  .  calcium-vitamin D (OSCAL WITH D) 500-200 MG-UNIT per tablet, Take 1 tablet by mouth daily.  , Disp: , Rfl:  .  cephALEXin (KEFLEX) 500 MG capsule, Take 1 capsule (500 mg total) by mouth 4 (four) times daily. As needed for skin infection, Disp: 28 capsule, Rfl: 0 .  diphenhydrAMINE (BENADRYL) 25 MG tablet, Take 25 mg by mouth every 8 (eight) hours as needed for allergies (allergy headaches.). , Disp: , Rfl:  .  DULoxetine (CYMBALTA) 60 MG capsule, Take 90 mg by mouth daily. , Disp: , Rfl:  .  estradiol (VIVELLE-DOT) 0.075 MG/24HR, PLACE 1 PATCH ONTO THE SKIN 2 TIMES A WEEK, Disp: 24 patch, Rfl: 1 .  fluocinonide (LIDEX) 0.05 % cream, Apply 1 application topically 2 (two) times daily as needed (for eczema). , Disp: , Rfl:  .  gabapentin (NEURONTIN) 100 MG capsule, , Disp: , Rfl:  .  LORazepam (ATIVAN) 0.5 MG tablet, Take 0.5 mg by mouth daily., Disp: , Rfl: 0 .  phentermine 30 MG  capsule, Take 1 capsule (30 mg total) by mouth every morning., Disp: 30 capsule, Rfl: 1  EXAM: BP Readings from Last 3 Encounters:  02/07/18 114/72  08/23/17 112/64  06/23/17 122/81    VITALS per patient if applicable: looks well   GENERAL: alert, oriented, appears well and in no acute distress  HEENT: atraumatic, conjunttiva clear, no obvious abnormalities on inspection of external nose and ears NECK: normal movements of the head and neck LUNGS: on inspection no signs of respiratory distress, breathing rate appears normal, no obvious gross SOB, gasping or wheezing CV: no obvious cyanosis MS: LLE  Wrapped  ( patient)  No ob redness  Cannot see  Weep[ing are   PSYCH/NEURO: pleasant and cooperative, no obvious depression or anxiety, speech and thought processing grossly intact Lab Results  Component Value Date   WBC 6.0 04/20/2017   HGB 13.3 04/20/2017   HCT 39.9 04/20/2017   PLT 256.0 04/20/2017   GLUCOSE 95 04/20/2017   CHOL 258 (H) 04/20/2017   TRIG 50.0 04/20/2017   HDL 69.70 04/20/2017   LDLDIRECT 149.3 06/11/2012   LDLCALC 178 (H) 04/20/2017   ALT 13 04/20/2017   AST 16 04/20/2017   NA 139 04/20/2017   K 4.7 04/20/2017   CL 103 04/20/2017   CREATININE 1.04 04/20/2017   BUN 13 04/20/2017   CO2 30 04/20/2017   TSH 1.30 04/20/2017    ASSESSMENT AND PLAN:  Discussed the following assessment and plan:    ICD-10-CM   1. 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 Lymphedema of left leg  I89.0    Sounds like aggravation of Lymphedema w edema with skin break down   caution to avoid infection  And proceed with aggressive elevation and wrapping and  Close fu   consider referral  ( she is aware of wound person in [place] that does le  Effects lymphedema) refill antibiotic in case needed   Observe low threshold to begin   She may try the  Lymphopress ?sp she has at home used in remote past  .   Let us know if need to do referral   Take pix of area  No alarm findings otherwise  today Counseled.   Expectant management and discussion of plan and treatment with opportunity to ask questions and all were answered. The patient agreed with the plan and demonstrated an understanding of the instructions.   Advised to call back or seek an in-person evaluation if worsening  or  having  further concerns .  Shanon Ace, MD

## 2018-11-29 ENCOUNTER — Other Ambulatory Visit: Payer: Self-pay | Admitting: Internal Medicine

## 2018-12-03 LAB — HM MAMMOGRAPHY

## 2018-12-17 ENCOUNTER — Encounter: Payer: Self-pay | Admitting: Internal Medicine

## 2019-03-02 ENCOUNTER — Ambulatory Visit: Payer: Federal, State, Local not specified - PPO | Attending: Internal Medicine

## 2019-03-02 DIAGNOSIS — Z23 Encounter for immunization: Secondary | ICD-10-CM | POA: Insufficient documentation

## 2019-03-02 NOTE — Progress Notes (Signed)
   Covid-19 Vaccination Clinic  Name:  Lindsay Parks    MRN: [id number] DOB: 03-30-1955  03/02/2019  [name] was observed post Covid-19 immunization for 15 minutes without incidence. She was provided with Vaccine Information Sheet and instruction to access the V-Safe system.   [name] was instructed to call 911 with any severe reactions post vaccine: Marland Kitchen Difficulty breathing  . Swelling of your face and throat  . A fast heartbeat  . A bad rash all over your body  . Dizziness and weakness    Immunizations Administered    Name Date Dose VIS Date Route   Pfizer COVID-19 Vaccine 03/02/2019  9:33 AM 0.3 mL 12/14/2018 Intramuscular   Manufacturer: [employer], [employer]   Lot: [id number]   NDC: [address]-8

## 2019-03-21 ENCOUNTER — Telehealth: Payer: Self-pay | Admitting: Internal Medicine

## 2019-03-21 NOTE — Telephone Encounter (Signed)
Medication Refill: Cephalexin Pharmacy: [facility] [address] [place]  Phone:

## 2019-03-21 NOTE — Telephone Encounter (Signed)
Spoke to pt and she stated that she lost her podiatrist. PT stated that she is wanting to cut her toenails. Pt stated that in the past Dr.Panosh has sent the abx in die to having lymphedema.    Please advise if ok to send in if appropriate please advise on sig.

## 2019-03-22 MED ORDER — CEPHALEXIN 500 MG PO CAPS: 500.0000 mg | ORAL_CAPSULE | Freq: Four times a day (QID) | ORAL | 0 refills | Status: DC

## 2019-03-22 NOTE — Telephone Encounter (Signed)
Pt return call and was advised of update.

## 2019-03-22 NOTE — Telephone Encounter (Signed)
Left message informing pt of update.  

## 2019-03-22 NOTE — Telephone Encounter (Signed)
Sent in electronically . The antibiotic   She uses this as needed for early infection ( at risk cause of milroys lymphedema)

## 2019-03-23 ENCOUNTER — Ambulatory Visit: Payer: Federal, State, Local not specified - PPO | Attending: Internal Medicine

## 2019-03-23 DIAGNOSIS — Z23 Encounter for immunization: Secondary | ICD-10-CM

## 2019-03-23 NOTE — Progress Notes (Signed)
   Covid-19 Vaccination Clinic  Name:  NOVIA LANSBERRY    MRN: [id number] DOB: [birth year]  03/23/2019  Ms. Rosten was observed post Covid-19 immunization for 15 minutes without incident. She was provided with Vaccine Information Sheet and instruction to access the V-Safe system.   Ms. Vasko was instructed to call 911 with any severe reactions post vaccine: Marland Kitchen Difficulty breathing  . Swelling of face and throat  . A fast heartbeat  . A bad rash all over body  . Dizziness and weakness   Immunizations Administered    Name Date Dose VIS Date Route   Pfizer COVID-19 Vaccine 03/23/2019 10:17 AM 0.3 mL 12/14/2018 Intramuscular   Manufacturer: [employer], [employer]   Lot: [id number]   NDC: [address]-8

## 2019-03-27 ENCOUNTER — Ambulatory Visit: Payer: Federal, State, Local not specified - PPO

## 2019-04-28 IMAGING — RF DG C-ARM 61-120 MIN
1 series · 2 of 2 positions shown · non-contrast
Comparison: None.

CLINICAL DATA: Anterior cervical discectomy and fusion for
decompression, one level at C6-7 via right-sided approach.

EXAM:
DG C-ARM 61-120 MIN; CERVICAL SPINE - 2-3 VIEW

[Series 1: run · 2 of 2 slices shown]
[im 1/2]
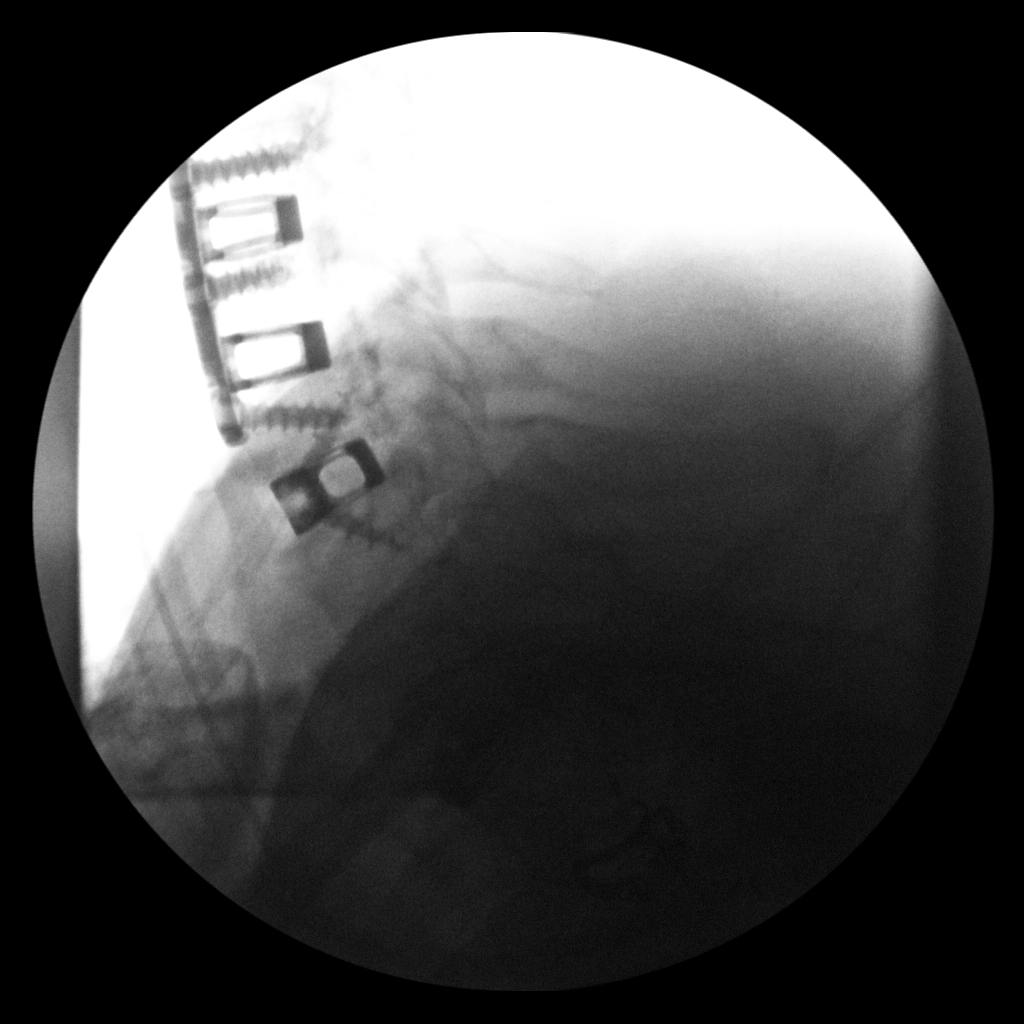
[im 2/2]
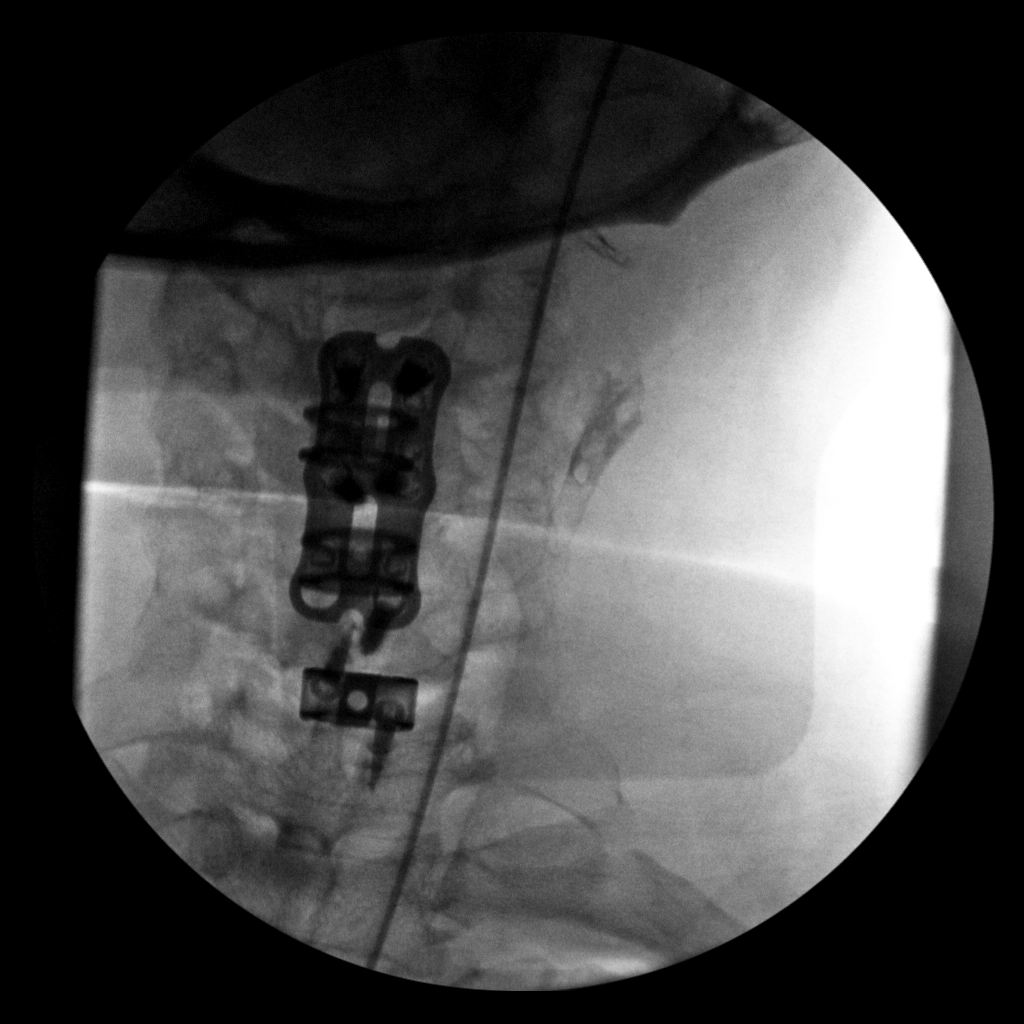

[2 of 2 positions shown; findings below may reference images not displayed]

FINDINGS: 1 minutes 2 seconds of fluoroscopic time was utilized. Two views of
the lower cervical spine demonstrate ACDF across the C6-7
interspace. Additionally there is anterior plate and screw fixation
across the C4 through C6 interspace. No immediate complications.
IMPRESSION: 1. One level ACDF at C6-7. No immediate postoperative complications.
2. Additionally, anterior plate and screw fixation across the C4
through C6 interspace with interbody blocks are also noted without
complication.

## 2019-05-18 ENCOUNTER — Other Ambulatory Visit: Payer: Self-pay | Admitting: Internal Medicine

## 2019-07-11 ENCOUNTER — Other Ambulatory Visit: Payer: Self-pay | Admitting: Internal Medicine

## 2019-07-23 ENCOUNTER — Other Ambulatory Visit: Payer: Self-pay | Admitting: Internal Medicine

## 2019-07-29 ENCOUNTER — Other Ambulatory Visit: Payer: Self-pay

## 2019-07-29 ENCOUNTER — Telehealth (INDEPENDENT_AMBULATORY_CARE_PROVIDER_SITE_OTHER): Payer: Federal, State, Local not specified - PPO | Admitting: Internal Medicine

## 2019-07-29 ENCOUNTER — Encounter: Payer: Self-pay | Admitting: Internal Medicine

## 2019-07-29 VITALS — Temp 97.3°F | Ht 66.0 in | Wt 222.0 lb

## 2019-07-29 DIAGNOSIS — E785 Hyperlipidemia, unspecified: Secondary | ICD-10-CM

## 2019-07-29 DIAGNOSIS — E2839 Other primary ovarian failure: Secondary | ICD-10-CM

## 2019-07-29 DIAGNOSIS — G4700 Insomnia, unspecified: Secondary | ICD-10-CM

## 2019-07-29 DIAGNOSIS — Q820 Hereditary lymphedema: Secondary | ICD-10-CM

## 2019-07-29 DIAGNOSIS — Z79899 Other long term (current) drug therapy: Secondary | ICD-10-CM | POA: Diagnosis not present

## 2019-07-29 DIAGNOSIS — Z79890 Hormone replacement therapy (postmenopausal): Secondary | ICD-10-CM

## 2019-07-29 MED ORDER — ESTRADIOL 0.05 MG/24HR TD PTTW: 1.0000 | MEDICATED_PATCH | TRANSDERMAL | 6 refills | Status: DC

## 2019-07-29 NOTE — Progress Notes (Signed)
Virtual Visit via Video Note  I connected with@ on 07/29/19 at 11:00 AM EDT by a video enabled telemedicine application and verified that I am speaking with the correct person using two identifiers. Location patient: home Location provider:work office Persons participating in the virtual visit: patient, provider  [place] national recommendations  regarding COVID 19 pandemic   video visit is advised over in office visit for this patient.  Patient aware  of the limitations of evaluation and management by telemedicine and  availability of in person appointments. and agreed to proceed.   HPI: Lindsay Parks presents for video visit  Med eval Refill estradiol  Patch  Couldn't find her rx when moved recent and kept on patch for 3 weeks and no serious se wd at this time  But lots going on  Had estate sale  And sold house  And now is  [employer] town home   Not yet unpacked   Husband has parkinson's and now in assisted living [facility] and her mom is also in [place]   Now off Ambien as wasn't working   Finally on  Seroquel at night helps anxiety and sleep  Had lostrt weight to  199 but many changes  Recently  No other major cahnge in health    ROS: See pertinent positives and negatives per HPI. No cv sx new   Past Medical History:  Diagnosis Date   Allergy    SEASONAL   Anxiety    Arthritis    Cataract    Depression    GERD (gastroesophageal reflux disease)    no longer since cholecystectomy   History of esophageal stricture    History of pancreatitis    Hx of colonic polyps    Lymph edema    Milroys disease  left more than right   Meningitis    PONV (postoperative nausea and vomiting)    UTI (lower urinary tract infection)     Past Surgical History:  Procedure Laterality Date   ANTERIOR CERVICAL DECOMP/DISCECTOMY FUSION N/A 03/16/2017   Procedure: ANTERIOR CERVICAL DISCECTOMY AND FUSION FOR DECOMPRESSION ONE  LEVEL CERVICAL SIX-SEVEN  RIGHT SIDED APPROACH;   Surgeon: Venita Lick, MD;  Location: [facility] OR;  Service: Orthopedics;  Laterality: N/A;   CERVICAL DISCECTOMY     c5 and c6   CHOLECYSTECTOMY     COLONOSCOPY     ELBOW SURGERY     ESOPHAGOGASTRODUODENOSCOPY     FLEXIBLE SIGMOIDOSCOPY     PARTIAL HYSTERECTOMY  2005   left hydrosalpinx ovarian cyst   SHOULDER SURGERY     TOTAL ABDOMINAL HYSTERECTOMY      Family History  Problem Relation Age of Onset   Stroke Mother    Diabetes Mother    Hypertension Mother    Heart disease Mother    Osteoporosis Mother    Arthritis Mother    Hyperlipidemia Mother    Glaucoma Father    Hypertension Father    Gout Father    Hyperlipidemia Father    Cancer Father    Colon cancer Paternal Grandmother        aunts and uncles   Pancreatic cancer Paternal Grandfather        lung, and stomach   Diabetes Other        grandson   Heart disease Maternal Grandmother    Kidney disease Paternal Uncle     Social History   Tobacco Use   Smoking status: Never Smoker   Smokeless tobacco: Never Used  Vaping Use   Vaping Use: Never used  Substance Use Topics   Alcohol use: Yes    Comment: socially   Drug use: No      Current Outpatient Medications:    calcium-vitamin D (OSCAL WITH D) 500-200 MG-UNIT per tablet, Take 1 tablet by mouth daily.  , Disp: , Rfl:    cephALEXin (KEFLEX) 500 MG capsule, Take 1 capsule (500 mg total) by mouth 4 (four) times daily. As needed for skin infection, Disp: 28 capsule, Rfl: 0   diphenhydrAMINE (BENADRYL) 25 MG tablet, Take 25 mg by mouth every 8 (eight) hours as needed for allergies (allergy headaches.). , Disp: , Rfl:    fluocinonide (LIDEX) 0.05 % cream, Apply 1 application topically 2 (two) times daily as needed (for eczema). , Disp: , Rfl:    LORazepam (ATIVAN) 0.5 MG tablet, Take 0.5 mg by mouth as needed. , Disp: , Rfl: 0   QUEtiapine (SEROQUEL XR) 300 MG 24 hr tablet, Take by mouth., Disp: , Rfl:    estradiol  (VIVELLE-DOT) 0.05 MG/24HR patch, Place 1 patch (0.05 mg total) onto the skin 2 (two) times a week., Disp: 8 patch, Rfl: 6   gabapentin (NEURONTIN) 100 MG capsule, , Disp: , Rfl:   EXAM: BP Readings from Last 3 Encounters:  02/07/18 114/72  08/23/17 112/64  06/23/17 122/81    VITALS per patient if applicable: GENERAL: alert, oriented, appears well and in no acute distress HEENT: atraumatic, conjunttiva clear, no obvious abnormalities on inspection of external nose and ears NECK: normal movements of the head and neck LUNGS: on inspection no signs of respiratory distress, breathing rate appears normal, no obvious gross SOB, gasping or wheezing CV: no obvious cyanosis  PSYCH/NEURO: pleasant and cooperative, no obvious depression or anxiety, speech and thought processing grossly intact Lab Results  Component Value Date   WBC 6.0 04/20/2017   HGB 13.3 04/20/2017   HCT 39.9 04/20/2017   PLT 256.0 04/20/2017   GLUCOSE 95 04/20/2017   CHOL 258 (H) 04/20/2017   TRIG 50.0 04/20/2017   HDL 69.70 04/20/2017   LDLDIRECT 149.3 06/11/2012   LDLCALC 178 (H) 04/20/2017   ALT 13 04/20/2017   AST 16 04/20/2017   NA 139 04/20/2017   K 4.7 04/20/2017   CL 103 04/20/2017   CREATININE 1.04 04/20/2017   BUN 13 04/20/2017   CO2 30 04/20/2017   TSH 1.30 04/20/2017    ASSESSMENT AND PLAN:  Discussed the following assessment and plan:    ICD-10-CM   1. Hormone replacement therapy (HRT)  Z79.890   2. Medication management  Z79.899 CBC with Differential/Platelet    Hemoglobin A1c    Hepatic function panel    Lipid panel    TSH    BASIC METABOLIC PANEL WITH GFR  3. MILROY'S DISEASE  Q82.0 CBC with Differential/Platelet    Hemoglobin A1c    Hepatic function panel    Lipid panel    TSH    BASIC METABOLIC PANEL WITH GFR  4. Hyperlipidemia, unspecified hyperlipidemia type  E78.5 CBC with Differential/Platelet    Hemoglobin A1c    Hepatic function panel    Lipid panel    TSH    BASIC  METABOLIC PANEL WITH GFR  5. Estrogen deficiency  E28.39 CBC with Differential/Platelet    Hemoglobin A1c    Hepatic function panel    Lipid panel    TSH    BASIC METABOLIC PANEL WITH GFR  6. Insomnia, unspecified type  G47.00  under care    now on serequel   Due for lab monitoring  Trial of lower dose patches  Once or twice a week and go from there  May even tolerate lower dose if needed  Get fasting lab appt  As planned \ Get in persons cps or other in the next 3-4 months or so  Counseled.   Expectant management and discussion of plan and treatment with opportunity to ask questions and all were answered. The patient agreed with the plan and demonstrated an understanding of the instructions.   Advised to call back or seek an in-person evaluation if worsening  or having  further concerns . Return for fasting lab     cpx in person in next 3-4 months .   Berniece Andreas, MD

## 2019-09-17 ENCOUNTER — Other Ambulatory Visit: Payer: Self-pay | Admitting: Internal Medicine

## 2019-09-20 NOTE — Progress Notes (Signed)
Chief Complaint  Patient presents with  . Annual Exam    Doing okay    HPI: Patient  Lindsay Parks  64 y.o. comes in today for Preventive Health Care visit  And CDM   Milroys compression  Working.  But off today working to prevent skin breakdown  Has successfully moved in July  Right shoulder problem pain and stiff   Uncertain if could also be from c spine since she has had 2 surgeries   Sleep ok on current regimen   Had lost weight until move and will get back on track daid had gotten below 200   HRT never got the 0.05 dose to pharmacy so never dec dose  nees another rx    Health Maintenance  Topic Date Due  . Hepatitis C Screening  Never done  . HIV Screening  Never done  . COLONOSCOPY  05/16/2016  . TETANUS/TDAP  04/26/2020  . MAMMOGRAM  12/02/2020  . INFLUENZA VACCINE  Completed  . COVID-19 Vaccine  Completed   Health Maintenance Review LIFESTYLE:  Exercise:   Not enough moved . Still adjusted  July 1 .  Tobacco/ETS:  no Alcohol: no Sugar beverages:  3 per week  Sleep: good  9 hours   And more  Drug use: no HH of  1  1 pet  Work:  Retired    Tax adviserBusy   Medical caretaker .   ROS:  See hpi vision good GEN/ HEENT: No fever, significant weight changes sweats headaches vision problems hearing changes, CV/ PULM; No chest pain shortness of breath cough, syncope,edema  change in exercise tolerance. GI /GU: No adominal pain, vomiting, change in bowel habits. No blood in the stool. No significant GU symptoms. SKIN/HEME: ,no acute skin rashes suspicious lesions or bleeding. No lymphadenopathy, nodules, masses.  NEURO/ PSYCH:  No neurologic signs such as weakness numbness. No depression anxiety. IMM/ Allergy: No unusual infections.  Allergy .   REST of 12 system review negative except as per HPI   Past Medical History:  Diagnosis Date  . Allergy    SEASONAL  . Anxiety   . Arthritis   . Cataract   . Depression   . GERD (gastroesophageal reflux disease)    no  longer since cholecystectomy  . History of esophageal stricture   . History of pancreatitis   . Hx of colonic polyps   . 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 Meningitis   . PONV (postoperative nausea and vomiting)   . UTI (lower urinary tract infection)     Past Surgical History:  Procedure Laterality Date  . ANTERIOR CERVICAL DECOMP/DISCECTOMY FUSION N/A 03/16/2017   Procedure: ANTERIOR CERVICAL DISCECTOMY AND FUSION FOR DECOMPRESSION ONE  LEVEL CERVICAL SIX-SEVEN  RIGHT SIDED APPROACH;  Surgeon: Venita LickBrooks, Dahari, MD;  Location: [facility] OR;  Service: Orthopedics;  Laterality: N/A;  . CERVICAL DISCECTOMY     c5 and c6  . CHOLECYSTECTOMY    . COLONOSCOPY    . ELBOW SURGERY    . ESOPHAGOGASTRODUODENOSCOPY    . FLEXIBLE SIGMOIDOSCOPY    . PARTIAL HYSTERECTOMY  2005   left hydrosalpinx ovarian cyst  . SHOULDER SURGERY    . TOTAL ABDOMINAL HYSTERECTOMY      Family History  Problem Relation Age of Onset  . Stroke Mother   . Diabetes Mother   . Hypertension Mother   . Heart disease Mother   . Osteoporosis Mother   . Arthritis Mother   .  Hyperlipidemia Mother   . Glaucoma Father   . Hypertension Father   . Gout Father   . Hyperlipidemia Father   . Cancer Father   . Colon cancer Paternal Grandmother        aunts and uncles  . Pancreatic cancer Paternal Grandfather        lung, and stomach  . Diabetes Other        grandson  . Heart disease Maternal Grandmother   . Kidney disease Paternal Uncle     Social History   Socioeconomic History  . Marital status: Married    Spouse name: Not on file  . Number of children: 2  . Years of education: Not on file  . Highest education level: Not on file  Occupational History  . Occupation: retired  Tobacco Use  . Smoking status: Never Smoker  . Smokeless tobacco: Never Used  Vaping Use  . Vaping Use: Never used  Substance and Sexual Activity  . Alcohol use: Yes    Comment: socially  . Drug use: No  . Sexual  activity: Not on file  Other Topics Concern  . Not on file  Social History Narrative   Regular exercise- yes   Occupation: retired from [employer]   Married husband with parkinson's   2 children   care taking husband parkinson's  And  Elderly mom       Finished degree i   Social Determinants of Health   Financial Resource Strain:   . Difficulty of Paying Living Expenses: Not on file  Food Insecurity:   . Worried About Programme researcher, broadcasting/film/video in the Last Year: Not on file  . Ran Out of Food in the Last Year: Not on file  Transportation Needs:   . Lack of Transportation (Medical): Not on file  . Lack of Transportation (Non-Medical): Not on file  Physical Activity:   . Days of Exercise per Week: Not on file  . Minutes of Exercise per Session: Not on file  Stress:   . Feeling of Stress : Not on file  Social Connections:   . Frequency of Communication with Friends and Family: Not on file  . Frequency of Social Gatherings with Friends and Family: Not on file  . Attends Religious Services: Not on file  . Active Member of Clubs or Organizations: Not on file  . Attends Banker Meetings: Not on file  . Marital Status: Not on file    Outpatient Medications Prior to Visit  Medication Sig Dispense Refill  . calcium-vitamin D (OSCAL WITH D) 500-200 MG-UNIT per tablet Take 1 tablet by mouth daily.      . diphenhydrAMINE (BENADRYL) 25 MG tablet Take 25 mg by mouth every 8 (eight) hours as needed for allergies (allergy headaches.).     Marland Kitchen fluocinonide (LIDEX) 0.05 % cream Apply 1 application topically 2 (two) times daily as needed (for eczema).     . LORazepam (ATIVAN) 0.5 MG tablet Take 0.5 mg by mouth as needed.   0  . QUEtiapine (SEROQUEL XR) 300 MG 24 hr tablet Take by mouth.    . estradiol (VIVELLE-DOT) 0.05 MG/24HR patch Place 1 patch (0.05 mg total) onto the skin 2 (two) times a week. 8 patch 6  . cephALEXin (KEFLEX) 500 MG capsule Take 1 capsule (500 mg total) by mouth  4 (four) times daily. As needed for skin infection (Patient not taking: Reported on 09/23/2019) 28 capsule 0  . gabapentin (NEURONTIN) 100 MG capsule  (  Patient not taking: Reported on 07/29/2019)     No facility-administered medications prior to visit.     EXAM:  BP 120/78   Pulse 92   Temp 98.2 F (36.8 C) (Oral)   Ht 5' 6.1" (1.679 m)   Wt 214 lb 12.8 oz (97.4 kg)   SpO2 93%   BMI 34.56 kg/m   Body mass index is 34.56 kg/m. Wt Readings from Last 3 Encounters:  09/23/19 214 lb 12.8 oz (97.4 kg)  07/29/19 (!) 222 lb (100.7 kg)  02/07/18 205 lb 12.8 oz (93.4 kg)    Physical Exam: Vital signs reviewed EXB:MWUX is a well-developed well-nourished alert cooperative    who appearsr stated age in no acute distress.  HEENT: normocephalic atraumatic , Eyes: PERRL EOM's full, conjunctiva clear, Nares: paten,t no deformity discharge or tenderness., Ears: no deformity EAC's clear TMs with normal landmarks. Mouth: masked  NECK: supple without masses, thyromegaly or bruits. CHEST/PULM:  Clear to auscultation and percussion breath sounds equal no wheeze , rales or rhonchi. No chest wall deformities or tenderness. Breast: normal by inspection . No dimpling, discharge, masses, tenderness or discharge . CV: PMI is nondisplaced, S1 S2 no gallops, murmurs, rubs. Peripheral pulses are full without delay.No JVD .  ABDOMEN: Bowel sounds normal nontender  No guard or rebound, no hepato splenomegal no CVA tenderness.   Extremtities:  No clubbing cyanosis left leg edema ( lypmedema  No sig skin break down ), no acute joint swelling or redness no focal atrophy NEURO:  Oriented x3, cranial nerves 3-12 appear to be intact, no obvious focal weakness,gait within normal limits no abnormal reflexes or asymmetrical SKIN: No acute rashes normal turgor, color, no bruising or petechiae. PSYCH: Oriented, good eye contact, no obvious depression anxiety, cognition and judgment appear normal. LN: no cervical axillary  adenopathy  Lab Results  Component Value Date   WBC 6.0 04/20/2017   HGB 13.3 04/20/2017   HCT 39.9 04/20/2017   PLT 256.0 04/20/2017   GLUCOSE 95 04/20/2017   CHOL 258 (H) 04/20/2017   TRIG 50.0 04/20/2017   HDL 69.70 04/20/2017   LDLDIRECT 149.3 06/11/2012   LDLCALC 178 (H) 04/20/2017   ALT 13 04/20/2017   AST 16 04/20/2017   NA 139 04/20/2017   K 4.7 04/20/2017   CL 103 04/20/2017   CREATININE 1.04 04/20/2017   BUN 13 04/20/2017   CO2 30 04/20/2017   TSH 1.30 04/20/2017    BP Readings from Last 3 Encounters:  09/23/19 120/78  02/07/18 114/72  08/23/17 112/64    Lab plan  Is fasting today  reviewed with patient   ASSESSMENT AND PLAN:  Discussed the following assessment and plan:    ICD-10-CM   1. Visit for preventive health examination  Z00.00 TSH    Hepatic function panel    Lipid panel    BASIC METABOLIC PANEL WITH GFR    CBC with Differential/Platelet    CBC with Differential/Platelet    BASIC METABOLIC PANEL WITH GFR    Lipid panel    Hepatic function panel    TSH  2. Medication management  Z79.899 TSH    Hepatic function panel    Lipid panel    BASIC METABOLIC PANEL WITH GFR    CBC with Differential/Platelet    CBC with Differential/Platelet    BASIC METABOLIC PANEL WITH GFR    Lipid panel    Hepatic function panel    TSH    BASIC METABOLIC PANEL WITH GFR  3.  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 Estrogen  deficiency  E28.39 TSH    Hepatic function panel    Lipid panel    BASIC METABOLIC PANEL WITH GFR    CBC with Differential/Platelet    CBC with Differential/Platelet    BASIC METABOLIC PANEL WITH GFR    Lipid panel    Hepatic function panel    TSH    BASIC METABOLIC PANEL WITH GFR  6. Right shoulder pain, unspecified chronicity  M25.511   7. Hx of cervical spine surgery  Z98.890   8. Need for influenza vaccination  Z23 Flu Vaccine QUAD 6+ mos PF IM (Fluarix Quad PF)  9. Hyperlipidemia, unspecified hyperlipidemia type  E78.5 BASIC METABOLIC PANEL WITH GFR    Colon flu  Updated utd  Now on 10 years recall? local leg care   Weight management knows what to do . Refilled the lower dose of  hrt  Return in about 1 year (around 09/22/2020).  Patient Care Team: Filomeno Cromley, Neta Mends, MD as PCP - General O'Neal, Sharyne Peach (Nurse Practitioner) Toni Arthurs, MD as Consulting Physician (Orthopedic Surgery) Patient Instructions  Will notify you  of labs when available.  Continued  Leg care . [facility] if [facility] still a problem .   Refilled  hrt patch lower dose .  Flu vaccine today    Health Maintenance, Female Adopting a healthy lifestyle and getting preventive care are important in promoting health and wellness. Ask your health care provider about:  The right schedule for you to have regular tests and exams.  Things you can do on your own to prevent diseases and keep yourself healthy. What should I know about diet, weight, and exercise? Eat a healthy diet   Eat a diet that includes plenty of vegetables, fruits, low-fat dairy products, and lean protein.  Do not eat a lot of foods that are high in solid fats, added sugars, or sodium. Maintain a healthy weight Body mass index (BMI) is used to identify weight problems. It estimates body fat based on height and weight. Your health care provider can help determine your BMI and help you achieve or maintain a healthy weight. Get  regular exercise Get regular exercise. This is one of the most important things you can do for your health. Most adults should:  Exercise for at least 150 minutes each week. The exercise should increase your heart rate and make you sweat (moderate-intensity exercise).  Do strengthening exercises at least twice a week. This is in addition to the moderate-intensity exercise.  Spend less time sitting. Even light physical activity can be beneficial. Watch cholesterol and blood lipids Have your blood tested for lipids and cholesterol at 64 years of age, then have this test every 5 years. Have  your cholesterol levels checked more often if:  Your lipid or cholesterol levels are high.  You are older than 64 years of age.  You are at high risk for heart disease. What should I know about cancer screening? Depending on your health history and family history, you may need to have cancer screening at various ages. This may include screening for:  Breast cancer.  Cervical cancer.  Colorectal cancer.  Skin cancer.  Lung cancer. What should I know about heart disease, diabetes, and high blood pressure? Blood pressure and heart disease  High blood pressure causes heart disease and increases the risk of stroke. This is more likely to develop in people who have high blood pressure readings, are of African descent, or are overweight.  Have your blood pressure checked: ? Every 3-5 years if you are 36-74 years of age. ? Every year if you are 7 years old or older. Diabetes Have regular diabetes screenings. This checks your fasting blood sugar level. Have the screening done:  Once every three years after age 72 if you are at a normal weight and have a low risk for diabetes.  More often and at a younger age if you are overweight or have a high risk for diabetes. What should I know about preventing infection? Hepatitis B If you have a higher risk for hepatitis B, you should be screened for this  virus. Talk with your health care provider to find out if you are at risk for hepatitis B infection. Hepatitis C Testing is recommended for:  Everyone born from [birth year] through 1965.  Anyone with known risk factors for hepatitis C. Sexually transmitted infections (STIs)  Get screened for STIs, including gonorrhea and chlamydia, if: ? You are sexually active and are younger than 64 years of age. ? You are older than 64 years of age and your health care provider tells you that you are at risk for this type of infection. ? Your sexual activity has changed since you were last screened, and you are at increased risk for chlamydia or gonorrhea. Ask your health care provider if you are at risk.  Ask your health care provider about whether you are at high risk for HIV. Your health care provider may recommend a prescription medicine to help prevent HIV infection. If you choose to take medicine to prevent HIV, you should first get tested for HIV. You should then be tested every 3 months for as long as you are taking the medicine. Pregnancy  If you are about to stop having your period (premenopausal) and you may become pregnant, seek counseling before you get pregnant.  Take 400 to 800 micrograms (mcg) of folic acid every day if you become pregnant.  Ask for birth control (contraception) if you want to prevent pregnancy. Osteoporosis and menopause Osteoporosis is a disease in which the bones lose minerals and strength with aging. This can result in bone fractures. If you are 70 years old or older, or if you are at risk for osteoporosis and fractures, ask your health care provider if you should:  Be screened for bone loss.  Take a calcium or vitamin D supplement to lower your risk of fractures.  Be given hormone replacement therapy (HRT) to treat symptoms of menopause. Follow these instructions at home: Lifestyle  Do not use any products that contain nicotine or tobacco, such as cigarettes,  e-cigarettes, and chewing tobacco. If you need help quitting, ask your health care provider.  Do not use street drugs.  Do not share needles.  Ask your health care provider for help if you need support or information about quitting drugs. Alcohol use  Do not drink alcohol if: ? Your health care provider tells you not to drink. ? You are pregnant, may be pregnant, or are planning to become pregnant.  If you drink alcohol: ? Limit how much you use to 0-1 drink a day. ? Limit intake if you are breastfeeding.  Be aware of how much alcohol is in your drink. In the [place] one drink equals one 12 oz bottle of beer (355 mL), one 5 oz glass of wine (148 mL), or one 1 oz glass of hard liquor (44 mL). General instructions  Schedule regular health, dental, and eye exams.  Stay current with your vaccines.  Tell your health care provider if: ? You often feel depressed. ? You have ever been abused or do not feel safe at home. Summary  Adopting a healthy lifestyle and getting preventive care are important in promoting health and wellness.  Follow your health care provider's instructions about healthy diet, exercising, and getting tested or screened for diseases.  Follow your health care provider's instructions on monitoring your cholesterol and blood pressure. This information is not intended to replace advice given to you by your health care provider. Make sure you discuss any questions you have with your health care provider. Document Revised: 12/13/2017 Document Reviewed: 12/13/2017 Elsevier Patient Education  2020 [employer].    [place] K. Cliffard Hair M.D.

## 2019-09-23 ENCOUNTER — Ambulatory Visit (INDEPENDENT_AMBULATORY_CARE_PROVIDER_SITE_OTHER): Payer: Federal, State, Local not specified - PPO | Admitting: Internal Medicine

## 2019-09-23 ENCOUNTER — Encounter: Payer: Self-pay | Admitting: Internal Medicine

## 2019-09-23 ENCOUNTER — Other Ambulatory Visit: Payer: Self-pay

## 2019-09-23 VITALS — BP 120/78 | HR 92 | Temp 98.2°F | Ht 66.1 in | Wt 214.8 lb

## 2019-09-23 DIAGNOSIS — Z79899 Other long term (current) drug therapy: Secondary | ICD-10-CM

## 2019-09-23 DIAGNOSIS — Q820 Hereditary lymphedema: Secondary | ICD-10-CM | POA: Diagnosis not present

## 2019-09-23 DIAGNOSIS — Z23 Encounter for immunization: Secondary | ICD-10-CM

## 2019-09-23 DIAGNOSIS — Z79890 Hormone replacement therapy (postmenopausal): Secondary | ICD-10-CM | POA: Diagnosis not present

## 2019-09-23 DIAGNOSIS — E2839 Other primary ovarian failure: Secondary | ICD-10-CM

## 2019-09-23 DIAGNOSIS — E785 Hyperlipidemia, unspecified: Secondary | ICD-10-CM

## 2019-09-23 DIAGNOSIS — Z0000 Encounter for general adult medical examination without abnormal findings: Secondary | ICD-10-CM | POA: Diagnosis not present

## 2019-09-23 DIAGNOSIS — M25511 Pain in right shoulder: Secondary | ICD-10-CM

## 2019-09-23 DIAGNOSIS — Z9889 Other specified postprocedural states: Secondary | ICD-10-CM

## 2019-09-23 MED ORDER — ESTRADIOL 0.05 MG/24HR TD PTTW: 1.0000 | MEDICATED_PATCH | TRANSDERMAL | 6 refills | Status: DC

## 2019-09-23 NOTE — Patient Instructions (Signed)
Will notify you  of labs when available.  Continued  Leg care . See [facility] if shoulder still a problem .   Refilled  hrt patch lower dose .  Flu vaccine today    Health Maintenance, Female Adopting a healthy lifestyle and getting preventive care are important in promoting health and wellness. Ask your health care provider about:  The right schedule for you to have regular tests and exams.  Things you can do on your own to prevent diseases and keep yourself healthy. What should I know about diet, weight, and exercise? Eat a healthy diet   Eat a diet that includes plenty of vegetables, fruits, low-fat dairy products, and lean protein.  Do not eat a lot of foods that are high in solid fats, added sugars, or sodium. Maintain a healthy weight Body mass index (BMI) is used to identify weight problems. It estimates body fat based on height and weight. Your health care provider can help determine your BMI and help you achieve or maintain a healthy weight. Get regular exercise Get regular exercise. This is one of the most important things you can do for your health. Most adults should:  Exercise for at least 150 minutes each week. The exercise should increase your heart rate and make you sweat (moderate-intensity exercise).  Do strengthening exercises at least twice a week. This is in addition to the moderate-intensity exercise.  Spend less time sitting. Even light physical activity can be beneficial. Watch cholesterol and blood lipids Have your blood tested for lipids and cholesterol at 64 years of age, then have this test every 5 years. Have your cholesterol levels checked more often if:  Your lipid or cholesterol levels are high.  You are older than 64 years of age.  You are at high risk for heart disease. What should I know about cancer screening? Depending on your health history and family history, you may need to have cancer screening at various ages. This may include  screening for:  Breast cancer.  Cervical cancer.  Colorectal cancer.  Skin cancer.  Lung cancer. What should I know about heart disease, diabetes, and high blood pressure? Blood pressure and heart disease  High blood pressure causes heart disease and increases the risk of stroke. This is more likely to develop in people who have high blood pressure readings, are of African descent, or are overweight.  Have your blood pressure checked: ? Every 3-5 years if you are 25-61 years of age. ? Every year if you are 19 years old or older. Diabetes Have regular diabetes screenings. This checks your fasting blood sugar level. Have the screening done:  Once every three years after age 39 if you are at a normal weight and have a low risk for diabetes.  More often and at a younger age if you are overweight or have a high risk for diabetes. What should I know about preventing infection? Hepatitis B If you have a higher risk for hepatitis B, you should be screened for this virus. Talk with your health care provider to find out if you are at risk for hepatitis B infection. Hepatitis C Testing is recommended for:  Everyone born from [birth year] through 1965.  Anyone with known risk factors for hepatitis C. Sexually transmitted infections (STIs)  Get screened for STIs, including gonorrhea and chlamydia, if: ? You are sexually active and are younger than 64 years of age. ? You are older than 64 years of age and your health care provider  tells you that you are at risk for this type of infection. ? Your sexual activity has changed since you were last screened, and you are at increased risk for chlamydia or gonorrhea. Ask your health care provider if you are at risk.  Ask your health care provider about whether you are at high risk for HIV. Your health care provider may recommend a prescription medicine to help prevent HIV infection. If you choose to take medicine to prevent HIV, you should first get  tested for HIV. You should then be tested every 3 months for as long as you are taking the medicine. Pregnancy  If you are about to stop having your period (premenopausal) and you may become pregnant, seek counseling before you get pregnant.  Take 400 to 800 micrograms (mcg) of folic acid every day if you become pregnant.  Ask for birth control (contraception) if you want to prevent pregnancy. Osteoporosis and menopause Osteoporosis is a disease in which the bones lose minerals and strength with aging. This can result in bone fractures. If you are 27 years old or older, or if you are at risk for osteoporosis and fractures, ask your health care provider if you should:  Be screened for bone loss.  Take a calcium or vitamin D supplement to lower your risk of fractures.  Be given hormone replacement therapy (HRT) to treat symptoms of menopause. Follow these instructions at home: Lifestyle  Do not use any products that contain nicotine or tobacco, such as cigarettes, e-cigarettes, and chewing tobacco. If you need help quitting, ask your health care provider.  Do not use street drugs.  Do not share needles.  Ask your health care provider for help if you need support or information about quitting drugs. Alcohol use  Do not drink alcohol if: ? Your health care provider tells you not to drink. ? You are pregnant, may be pregnant, or are planning to become pregnant.  If you drink alcohol: ? Limit how much you use to 0-1 drink a day. ? Limit intake if you are breastfeeding.  Be aware of how much alcohol is in your drink. In the [place] one drink equals one 12 oz bottle of beer (355 mL), one 5 oz glass of wine (148 mL), or one 1 oz glass of hard liquor (44 mL). General instructions  Schedule regular health, dental, and eye exams.  Stay current with your vaccines.  Tell your health care provider if: ? You often feel depressed. ? You have ever been abused or do not feel safe at  home. Summary  Adopting a healthy lifestyle and getting preventive care are important in promoting health and wellness.  Follow your health care provider's instructions about healthy diet, exercising, and getting tested or screened for diseases.  Follow your health care provider's instructions on monitoring your cholesterol and blood pressure. This information is not intended to replace advice given to you by your health care provider. Make sure you discuss any questions you have with your health care provider. Document Revised: 12/13/2017 Document Reviewed: 12/13/2017 Elsevier Patient Education  2020 [employer].

## 2019-09-24 LAB — CBC WITH DIFFERENTIAL/PLATELET
Absolute Monocytes: 383 cells/uL (ref 200–950)
Basophils Absolute: 32 cells/uL (ref 0–200)
Basophils Relative: 0.6 %
Eosinophils Absolute: 70 cells/uL (ref 15–500)
Eosinophils Relative: 1.3 %
HCT: 40.6 % (ref 35.0–45.0)
Hemoglobin: 13.3 g/dL (ref 11.7–15.5)
Lymphs Abs: 1496 cells/uL (ref 850–3900)
MCH: 29 pg (ref 27.0–33.0)
MCHC: 32.8 g/dL (ref 32.0–36.0)
MCV: 88.5 fL (ref 80.0–100.0)
MPV: 9.2 fL (ref 7.5–12.5)
Monocytes Relative: 7.1 %
Neutro Abs: 3418 cells/uL (ref 1500–7800)
Neutrophils Relative %: 63.3 %
Platelets: 277 10*3/uL (ref 140–400)
RBC: 4.59 10*6/uL (ref 3.80–5.10)
RDW: 13.4 % (ref 11.0–15.0)
Total Lymphocyte: 27.7 %
WBC: 5.4 10*3/uL (ref 3.8–10.8)

## 2019-09-24 LAB — LIPID PANEL
Cholesterol: 278 mg/dL — ABNORMAL HIGH (ref <200)
HDL: 72 mg/dL (ref >=50)
LDL Cholesterol (Calc): 187 mg/dL (calc) — ABNORMAL HIGH
Non-HDL Cholesterol (Calc): 206 mg/dL (calc) — ABNORMAL HIGH (ref <130)
Total CHOL/HDL Ratio: 3.9 (calc) (ref <5.0)
Triglycerides: 84 mg/dL (ref <150)

## 2019-09-24 LAB — HEPATIC FUNCTION PANEL
AG Ratio: 1.9 (calc) (ref 1.0–2.5)
ALT: 17 U/L (ref 6–29)
AST: 18 U/L (ref 10–35)
Albumin: 4.3 g/dL (ref 3.6–5.1)
Alkaline phosphatase (APISO): 85 U/L (ref 37–153)
Bilirubin, Direct: 0.1 mg/dL (ref 0.0–0.2)
Globulin: 2.3 g/dL (calc) (ref 1.9–3.7)
Indirect Bilirubin: 0.3 mg/dL (calc) (ref 0.2–1.2)
Total Bilirubin: 0.4 mg/dL (ref 0.2–1.2)
Total Protein: 6.6 g/dL (ref 6.1–8.1)

## 2019-09-24 LAB — BASIC METABOLIC PANEL WITH GFR
BUN/Creatinine Ratio: 10 (calc) (ref 6–22)
BUN: 11 mg/dL (ref 7–25)
CO2: 28 mmol/L (ref 20–32)
Calcium: 9.6 mg/dL (ref 8.6–10.4)
Chloride: 103 mmol/L (ref 98–110)
Creat: 1.09 mg/dL — ABNORMAL HIGH (ref 0.50–0.99)
GFR, Est African American: 62 mL/min/{1.73_m2} (ref >=60)
GFR, Est Non African American: 54 mL/min/{1.73_m2} — ABNORMAL LOW (ref >=60)
Glucose, Bld: 94 mg/dL (ref 65–99)
Potassium: 4.2 mmol/L (ref 3.5–5.3)
Sodium: 141 mmol/L (ref 135–146)

## 2019-09-24 LAB — TSH: TSH: 1.93 mIU/L (ref 0.40–4.50)

## 2019-10-19 ENCOUNTER — Ambulatory Visit: Payer: Self-pay

## 2019-11-01 NOTE — Progress Notes (Signed)
Results stable except cholesterol up from last year  and need to work on getting this level down  continue healthy weight loss  Life estyle intervention healthy eating and activity  The 10-year ASCVD risk score Denman George DC Jr., et al., 2013) is: 4.7%   Values used to calculate the score:     Age: 64 years     Sex: Female     Is Non-Hispanic African American: No     Diabetic: No     Tobacco smoker: No     Systolic Blood Pressure: 120 mmHg     Is BP treated: No     HDL Cholesterol: 72 mg/dL     Total Cholesterol: 278 mg/dL

## 2019-12-20 LAB — HM MAMMOGRAPHY

## 2020-01-14 ENCOUNTER — Encounter: Payer: Self-pay | Admitting: Internal Medicine

## 2020-03-28 ENCOUNTER — Other Ambulatory Visit: Payer: Self-pay | Admitting: Internal Medicine

## 2020-11-25 ENCOUNTER — Other Ambulatory Visit: Payer: Self-pay

## 2020-11-25 DIAGNOSIS — I890 Lymphedema, not elsewhere classified: Secondary | ICD-10-CM

## 2020-12-03 HISTORY — PX: SHOULDER ARTHROSCOPY: SHX128

## 2020-12-22 LAB — HM MAMMOGRAPHY

## 2020-12-30 ENCOUNTER — Encounter: Payer: Self-pay | Admitting: Internal Medicine

## 2021-01-27 ENCOUNTER — Encounter: Payer: Self-pay | Admitting: Internal Medicine

## 2021-04-10 ENCOUNTER — Other Ambulatory Visit: Payer: Self-pay | Admitting: Internal Medicine

## 2021-05-14 ENCOUNTER — Other Ambulatory Visit: Payer: Self-pay | Admitting: Internal Medicine

## 2021-05-25 ENCOUNTER — Encounter: Payer: Self-pay | Admitting: Family Medicine

## 2021-05-25 ENCOUNTER — Telehealth: Payer: Federal, State, Local not specified - PPO | Admitting: Internal Medicine

## 2021-05-25 ENCOUNTER — Telehealth (INDEPENDENT_AMBULATORY_CARE_PROVIDER_SITE_OTHER): Payer: Federal, State, Local not specified - PPO | Admitting: Family Medicine

## 2021-05-25 ENCOUNTER — Telehealth: Payer: Self-pay | Admitting: Internal Medicine

## 2021-05-25 VITALS — Temp 98.0°F

## 2021-05-25 DIAGNOSIS — R059 Cough, unspecified: Secondary | ICD-10-CM | POA: Diagnosis not present

## 2021-05-25 DIAGNOSIS — R0981 Nasal congestion: Secondary | ICD-10-CM | POA: Diagnosis not present

## 2021-05-25 MED ORDER — BENZONATATE 100 MG PO CAPS: ORAL_CAPSULE | ORAL | 0 refills | Status: DC

## 2021-05-25 NOTE — Telephone Encounter (Signed)
Patient calling in with respiratory symptoms: Shortness of breath, chest pain, palpitations or other red words send to Triage  Does the patient have a fever over 100, cough, congestion, sore throat, runny nose, lost of taste/smell (please list symptoms that patient has)?Cough,ha, nasal  did symptoms start?05-20-2021 (If over 5 days ago, pt may be scheduled for in person visit)  Have you tested for Covid in the last 5 days? No   If yes, was it positive []  OR negative [] ? If positive in the last 5 days, please schedule virtual visit now. If negative, schedule for an in person OV with the next available provider if PCP has no openings. Please also let patient know they will be tested again (follow the script below)  "you will have to arrive prior to your appt time to be Covid tested. Please park in back of office at the [facility] & call [phone] to let the staff know you have arrived. A staff member will meet you at your car to do a rapid covid test. Once the test has resulted you will be notified by phone of your results to determine if appt will remain an in person visit or be converted to a virtual/phone visit. If you arrive less than before your appt time, your visit will be automatically converted to virtual & any recommended testing will happen AFTER the visit." Pt has virtual with dr [phone] 05-25-2021 1 pm  THINGS TO REMEMBER  If no availability for virtual visit in office,  please schedule another [place] office  If no availability at another [place] office, please instruct patient that they can schedule an evisit or virtual visit through their mychart account. Visits up to 8pm  patients can be seen in office 5 days after positive COVID test

## 2021-05-25 NOTE — Progress Notes (Signed)
Virtual Visit via Telephone Note  I connected with Lindsay Parks on 05/25/21 at  1:00 PM EDT by telephone and verified that I am speaking with the correct person using two identifiers.   I discussed the limitations of performing an evaluation and management service by telephone and requested permission for a phone visit. The patient expressed understanding and agreed to proceed.  Location patient:  Lindsay Parks, visiting her mother Location provider: work or home office Participants present for the call: patient, provider Patient did not have a visit with me in the prior 7 days to address this/these issue(s).   History of Present Illness:  Acute telemedicine visit for : -Onset: 5 days ago -Symptoms include: clear nasal congestion,cough, PND, scratchy throat, cough -Denies:fever, CP - except occ bilat rib pain when coughing at times, SOB, wheezing, thick or discolored mucus, vomiting, diarrhea, malaise -Has tried: otc allergy medications, hot tea, honey -Pertinent past medical history: see below, reports does have seasonal allergy -Pertinent medication allergies: No Known Allergies -COVID-19 vaccine status: Immunization History  Administered Date(s) Administered   Influenza Inj Mdck Quad Pf 09/09/2020   Influenza Split 10/03/2012   Influenza Whole 12/12/2006, 09/24/2007, 10/15/2008   Influenza,inj,Quad PF,6+ Mos 11/16/2013, 08/26/2014, 09/19/2015, 10/17/2016, 08/23/2017, 09/04/2018, 09/23/2019   Influenza,inj,quad, With Preservative 01/04/2016   PFIZER(Purple Top)SARS-COV-2 Vaccination 03/02/2019, 03/23/2019   Pfizer Covid-19 Vaccine Bivalent Booster 73yrs & up 09/16/2020   Td 05/03/1997   Tdap 04/27/2010   Zoster Recombinat (Shingrix) 05/03/2017, 07/12/2017      Past Medical History:  Diagnosis Date   Allergy    SEASONAL   Anxiety    Arthritis    Cataract    Depression    GERD (gastroesophageal reflux disease)    no longer since cholecystectomy   History of esophageal stricture     History of pancreatitis    Hx of colonic polyps    Lymph edema    Milroys disease  left more than right   Meningitis    PONV (postoperative nausea and vomiting)    UTI (lower urinary tract infection)     Current Outpatient Medications on File Prior to Visit  Medication Sig Dispense Refill   calcium-vitamin D (OSCAL WITH D) 500-200 MG-UNIT per tablet Take 1 tablet by mouth daily.       diphenhydrAMINE (BENADRYL) 25 MG tablet Take 25 mg by mouth every 8 (eight) hours as needed for allergies (allergy headaches.).      ergocalciferol (VITAMIN D2) 1.25 MG (50000 UT) capsule Take 50,000 Units by mouth once a week.     estradiol (VIVELLE-DOT) 0.05 MG/24HR patch PLACE 1 PATCH (0.05 MG TOTAL) ONTO THE SKIN 2 (TWO) TIMES A WEEK. 8 patch 6   fluocinonide (LIDEX) 0.05 % cream Apply 1 application topically 2 (two) times daily as needed (for eczema).      LORazepam (ATIVAN) 0.5 MG tablet Take 0.5 mg by mouth as needed.   0   OZEMPIC, 0.25 OR 0.5 MG/DOSE, 2 MG/1.5ML SOPN Inject 0.5 mg into the skin once a week.     QUEtiapine (SEROQUEL XR) 300 MG 24 hr tablet Take by mouth.     No current facility-administered medications on file prior to visit.    Observations/Objective: Patient sounds cheerful and well on the phone. I do not appreciate any SOB. Speech and thought processing are grossly intact. Patient reported vitals:  Assessment and Plan:  Nasal congestion  Cough, unspecified type  -we discussed possible serious and likely etiologies, options for evaluation and workup, limitations  of telemedicine visit vs in person visit, treatment, treatment risks and precautions. Pt prefers to treat via telemedicine empirically rather than in person at this moment. Query allergic rhinitis, viral illness vs other. She has opted to do a covid test today and and contact a [facility] [place] pharmacy if positive wishes to do antiviral (discussed window.) Otherwise, she has opted to try empiric trial nasal saline  sinus rinse, flonase and Tessalon rx for cough.   Advised to seek prompt virtual visit or in person care if worsening, new symptoms arise, or if is not improving with treatment as expected per our conversation of expected course. Discussed options for follow up care. Did let this patient know that I do telemedicine on Tuesdays and Thursdays for [facility] and those are the days I am logged into the system. Advised to schedule follow up visit with PCP, [facility] virtual visits or [facility] if any further questions or concerns to avoid delays in care.   I discussed the assessment and treatment plan with the patient. The patient was provided an opportunity to ask questions and all were answered. The patient agreed with the plan and demonstrated an understanding of the instructions.    Follow Up Instructions:  I did not refer this patient for an OV with me in the next 24 hours for this/these issue(s).  I discussed the assessment and treatment plan with the patient. The patient was provided an opportunity to ask questions and all were answered. The patient agreed with the plan and demonstrated an understanding of the instructions.   I spent 19 minutes on the date of this visit in the care of this patient. See summary of tasks completed to properly care for this patient in the detailed notes above which also included counseling of above, review of PMH, medications, allergies, evaluation of the patient and ordering and/or  instructing patient on testing and care options.     Lindsay Kern, DO

## 2021-05-25 NOTE — Patient Instructions (Signed)
  HOME CARE TIPS:  -COVID19 testing information: [email or web address]  Most pharmacies also offer testing and home test kits. If the Covid19 test is positive and you desire antiviral treatment, please contact a [facility] pharmacy or schedule a follow up virtual visit through your primary care office or through the [employer].    -I sent the medication(s) we discussed to your pharmacy: Meds ordered this encounter  Medications   benzonatate (TESSALON PERLES) 100 MG capsule    Sig: 1-2 capsules up to twice daily as needed for cough    Dispense:  30 capsule    Refill:  0     -nasal saline sinus rinses twice daily  -flonase 2 sprays each nostril daily for 3 weeks  -stay hydrated, drink plenty of fluids and eat small healthy meals - avoid dairy  -follow up with your doctor in 2-3 days unless improving and feeling better  -stay home while sick, except to seek medical care. If you have COVID19, you will likely be contagious for 7-10 days. Flu or Influenza is likely contagious for about 7 days. Other respiratory viral infections remain contagious for 5-10+ days depending on the virus and many other factors. Wear a good mask that fits snugly (such as N95 or KN95) if around others to reduce the risk of transmission.  It was nice to meet you today, and I really hope you are feeling better soon. I help [facility] out with telemedicine visits on Tuesdays and Thursdays and am happy to help if you need a follow up virtual visit on those days. Otherwise, if you have any concerns or questions following this visit please schedule a follow up visit with your Primary Care doctor or seek care at a local urgent care clinic to avoid delays in care.    Seek in person care or schedule a follow up video visit promptly if your symptoms worsen, new concerns arise or you are not improving with treatment. Call 911 and/or seek emergency care if your symptoms are severe or life  threatening.

## 2021-05-27 ENCOUNTER — Encounter: Payer: Self-pay | Admitting: Gastroenterology

## 2021-06-08 ENCOUNTER — Telehealth: Payer: Self-pay | Admitting: Internal Medicine

## 2021-06-08 DIAGNOSIS — Z0279 Encounter for issue of other medical certificate: Secondary | ICD-10-CM

## 2021-06-08 NOTE — Telephone Encounter (Signed)
Patient stopped by to drop off forms to be completed &/or signed by Dr. Fabian Sharp. Pt completed and signed the "Forms to be filled out by Physician" sheet. This sheet was put in the envelope with the other documents and placed in the Dr's folder.  (IC)

## 2021-06-15 ENCOUNTER — Other Ambulatory Visit: Payer: Self-pay | Admitting: Internal Medicine

## 2021-06-16 ENCOUNTER — Other Ambulatory Visit: Payer: Self-pay

## 2021-06-16 NOTE — Telephone Encounter (Signed)
[place] summons Disability placard will be filled out Rx refills pending

## 2021-06-17 MED ORDER — ESTRADIOL 0.05 MG/24HR TD PTTW: 1.0000 | MEDICATED_PATCH | TRANSDERMAL | 6 refills | Status: DC

## 2021-06-17 MED ORDER — CEPHALEXIN 500 MG PO CAPS: 500.0000 mg | ORAL_CAPSULE | Freq: Four times a day (QID) | ORAL | 1 refills | Status: DC

## 2021-06-17 NOTE — Telephone Encounter (Signed)
Encounter message sent to PCP

## 2021-06-29 NOTE — Telephone Encounter (Signed)
Parking placard is ready for pick-up

## 2021-07-01 ENCOUNTER — Ambulatory Visit (AMBULATORY_SURGERY_CENTER): Payer: Self-pay | Admitting: *Deleted

## 2021-07-01 VITALS — Ht 66.0 in | Wt 217.0 lb

## 2021-07-01 DIAGNOSIS — Z1211 Encounter for screening for malignant neoplasm of colon: Secondary | ICD-10-CM

## 2021-07-01 DIAGNOSIS — Z800 Family history of malignant neoplasm of digestive organs: Secondary | ICD-10-CM

## 2021-07-01 MED ORDER — NA SULFATE-K SULFATE-MG SULF 17.5-3.13-1.6 GM/177ML PO SOLN: 1.0000 | ORAL | 0 refills | Status: DC

## 2021-07-01 NOTE — Progress Notes (Signed)
Patient is here in-person for PV. Patient denies any allergies to eggs or soy. Patient denies any problems with anesthesia/sedation. Patient is not on any oxygen at home. Patient is not taking any diet/weight loss medications or blood thinners. Went over procedure prep instructions with the patient. Patient is aware of our care-partner policy. Patient notified to use [employer] card given to pt for prescription.   EMMI education assigned to the patient for the procedure, sent to MyChart.   

## 2021-07-04 ENCOUNTER — Encounter: Payer: Self-pay | Admitting: Internal Medicine

## 2021-07-05 NOTE — Telephone Encounter (Signed)
Patient informed paperwork is ready for pick-up.

## 2021-07-21 ENCOUNTER — Encounter: Payer: Self-pay | Admitting: Gastroenterology

## 2021-07-29 ENCOUNTER — Ambulatory Visit (AMBULATORY_SURGERY_CENTER): Payer: Federal, State, Local not specified - PPO | Admitting: Gastroenterology

## 2021-07-29 ENCOUNTER — Encounter: Payer: Self-pay | Admitting: Gastroenterology

## 2021-07-29 VITALS — BP 118/54 | HR 79 | Temp 97.5°F | Resp 10 | Ht 66.0 in | Wt 217.0 lb

## 2021-07-29 DIAGNOSIS — Z1211 Encounter for screening for malignant neoplasm of colon: Secondary | ICD-10-CM

## 2021-07-29 DIAGNOSIS — Z800 Family history of malignant neoplasm of digestive organs: Secondary | ICD-10-CM | POA: Diagnosis not present

## 2021-07-29 MED ORDER — SODIUM CHLORIDE 0.9 % IV SOLN: 500.0000 mL | Freq: Once | INTRAVENOUS | Status: DC

## 2021-07-29 NOTE — Progress Notes (Signed)
VS completed by AS.  Pt's states no medical or surgical changes since previsit or office visit.  

## 2021-07-29 NOTE — Progress Notes (Signed)
[facility] History and Physical   Primary Care Physician:  Burnis Medin, MD   Reason for Procedure:  Family history of colon cancer  Plan:    Screening colonoscopy with possible interventions as needed     HPI: Lindsay Parks is a very pleasant 65 y.o. female here for screening colonoscopy. Denies any nausea, vomiting, abdominal pain, melena or bright red blood per rectum  The risks and benefits as well as alternatives of endoscopic procedure(s) have been discussed and reviewed. All questions answered. The patient agrees to proceed.    Past Medical History:  Diagnosis Date   Allergy    SEASONAL   Anxiety    Arthritis    Cataract    Depression    GERD (gastroesophageal reflux disease)    no longer since cholecystectomy   History of esophageal stricture    History of pancreatitis    Hx of colonic polyps    Lymph edema    Milroys disease  left more than right   Meningitis    PONV (postoperative nausea and vomiting)    UTI (lower urinary tract infection)     Past Surgical History:  Procedure Laterality Date   ANTERIOR CERVICAL DECOMP/DISCECTOMY FUSION N/A 03/16/2017   Procedure: ANTERIOR CERVICAL DISCECTOMY AND FUSION FOR DECOMPRESSION ONE  LEVEL CERVICAL SIX-SEVEN  RIGHT SIDED APPROACH;  Surgeon: Melina Schools, MD;  Location: [facility];  Service: Orthopedics;  Laterality: N/A;   CERVICAL DISCECTOMY     c5 and c6   CHOLECYSTECTOMY     COLONOSCOPY  05/17/2011   Dr.Kaplan   ELBOW SURGERY     ESOPHAGOGASTRODUODENOSCOPY     FLEXIBLE SIGMOIDOSCOPY     PARTIAL HYSTERECTOMY  01/04/2003   left hydrosalpinx ovarian cyst   SHOULDER ARTHROSCOPY Left 12/2020   SHOULDER SURGERY     TOTAL ABDOMINAL HYSTERECTOMY      Prior to Admission medications   Medication Sig Start Date End Date Taking? Authorizing Provider  calcium-vitamin D (OSCAL WITH D) 500-200 MG-UNIT per tablet Take 1 tablet by mouth daily.     Yes [provider]  ergocalciferol (VITAMIN  D2) 1.25 MG (50000 UT) capsule Take 50,000 Units by mouth once a week.   Yes [provider]  estradiol (VIVELLE-DOT) 0.05 MG/24HR patch Place 1 patch (0.05 mg total) onto the skin 2 (two) times a week. 06/17/21  Yes Panosh, Standley Brooking, MD  Omega-3 Fatty Acids (FISH OIL PO) Take by mouth.   Yes [provider]  QUEtiapine (SEROQUEL XR) 300 MG 24 hr tablet Take by mouth. 07/02/19  Yes [provider]  zolpidem (AMBIEN) 5 MG tablet Take 5 mg by mouth at bedtime. 06/18/21  Yes [provider]  diphenhydrAMINE (BENADRYL) 25 MG tablet Take 25 mg by mouth every 8 (eight) hours as needed for allergies (allergy headaches.).     [provider]  fluocinonide (LIDEX) 0.05 % cream Apply 1 application topically 2 (two) times daily as needed (for eczema).     [provider]  LORazepam (ATIVAN) 0.5 MG tablet Take 0.5 mg by mouth as needed.  12/14/16   [provider]  OZEMPIC, 0.25 OR 0.5 MG/DOSE, 2 MG/1.5ML SOPN Inject 0.5 mg into the skin once a week. 03/25/21   [provider]    Current Outpatient Medications  Medication Sig Dispense Refill   calcium-vitamin D (OSCAL WITH D) 500-200 MG-UNIT per tablet Take 1 tablet by mouth daily.       ergocalciferol (VITAMIN D2) 1.25 MG (  50000 UT) capsule Take 50,000 Units by mouth once a week.     estradiol (VIVELLE-DOT) 0.05 MG/24HR patch Place 1 patch (0.05 mg total) onto the skin 2 (two) times a week. 8 patch 6   Omega-3 Fatty Acids (FISH OIL PO) Take by mouth.     QUEtiapine (SEROQUEL XR) 300 MG 24 hr tablet Take by mouth.     zolpidem (AMBIEN) 5 MG tablet Take 5 mg by mouth at bedtime.     diphenhydrAMINE (BENADRYL) 25 MG tablet Take 25 mg by mouth every 8 (eight) hours as needed for allergies (allergy headaches.).      fluocinonide (LIDEX) 0.05 % cream Apply 1 application topically 2 (two) times daily as needed (for eczema).      LORazepam (ATIVAN) 0.5 MG tablet Take 0.5 mg by mouth as needed.   0    OZEMPIC, 0.25 OR 0.5 MG/DOSE, 2 MG/1.5ML SOPN Inject 0.5 mg into the skin once a week.     Current Facility-Administered Medications  Medication Dose Route Frequency Provider Last Rate Last Admin   0.9 %  sodium chloride infusion  500 mL Intravenous Once Ludene Stokke, Eleonore Chiquito, MD        Allergies as of 07/29/2021   (No Known Allergies)    Family History  Problem Relation Age of Onset   Stroke Mother    Diabetes Mother    Hypertension Mother    Heart disease Mother    Osteoporosis Mother    Arthritis Mother    Hyperlipidemia Mother    Colon cancer Father        late 20's early 73's when dx   Glaucoma Father    Hypertension Father    Gout Father    Hyperlipidemia Father    Cancer Father    Colon cancer Paternal Aunt    Colon cancer Paternal Uncle    Kidney disease Paternal Uncle    Heart disease Maternal Grandmother    Colon cancer Paternal Grandmother        aunts and uncles   Pancreatic cancer Paternal Grandfather        lung, and stomach   Diabetes Other        grandson   Esophageal cancer Neg Hx    Stomach cancer Neg Hx    Rectal cancer Neg Hx     Social History   Socioeconomic History   Marital status: Married    Spouse name: Not on file   Number of children: 2   Years of education: Not on file   Highest education level: Not on file  Occupational History   Occupation: retired  Tobacco Use   Smoking status: Never   Smokeless tobacco: Never  Vaping Use   Vaping Use: Never used  Substance and Sexual Activity   Alcohol use: Yes    Comment: socially-occ wine 1-2 times per month   Drug use: No   Sexual activity: Not on file  Other Topics Concern   Not on file  Social History Narrative   ** Merged History Encounter **       Regular exercise- yes Occupation: retired from [employer] Married husband with parkinson's 2 children care taking husband parkinson's  And  Elderly mom   Finished degree i   Social Determinants of Health   Financial  Resource Strain: Not on file  Food Insecurity: Not on file  Transportation Needs: Not on file  Physical Activity: Not on file  Stress: Not on file  Social Connections: Not on  file  Intimate Partner Violence: Not on file    Review of Systems:  All other review of systems negative except as mentioned in the HPI.  Physical Exam: Vital signs in last 24 hours: BP 127/79   Pulse 80   Temp (!) 97.5 F (36.4 C) (Temporal)   Ht 5\' 6"  (1.676 m)   Wt 217 lb (98.4 kg)   SpO2 100%   BMI 35.02 kg/m  General:   Alert, NAD Lungs:  Clear .   Heart:  Regular rate and rhythm Abdomen:  Soft, nontender and nondistended. Neuro/Psych:  Alert and cooperative. Normal mood and affect. A and O x 3  Reviewed labs, radiology imaging, old records and pertinent past GI work up  Patient is appropriate for planned procedure(s) and anesthesia in an ambulatory setting   K. , MD [phone]

## 2021-07-29 NOTE — Patient Instructions (Signed)
Handouts on diverticulosis and hemorrhoids given to patient. Resume previous diet and continue present medications. Repeat colonoscopy in 5 years for surveillance.  YOU HAD AN ENDOSCOPIC PROCEDURE TODAY AT THE [facility]:   Refer to the procedure report that was given to you for any specific questions about what was found during the examination.  If the procedure report does not answer your questions, please call your gastroenterologist to clarify.  If you requested that your care partner not be given the details of your procedure findings, then the procedure report has been included in a sealed envelope for you to review at your convenience later.  YOU SHOULD EXPECT: Some feelings of bloating in the abdomen. Passage of more gas than usual.  Walking can help get rid of the air that was put into your GI tract during the procedure and reduce the bloating. If you had a lower endoscopy (such as a colonoscopy or flexible sigmoidoscopy) you may notice spotting of blood in your stool or on the toilet paper. If you underwent a bowel prep for your procedure, you may not have a normal bowel movement for a few days.  Please Note:  You might notice some irritation and congestion in your nose or some drainage.  This is from the oxygen used during your procedure.  There is no need for concern and it should clear up in a day or so.  SYMPTOMS TO REPORT IMMEDIATELY:  Following lower endoscopy (colonoscopy or flexible sigmoidoscopy):  Excessive amounts of blood in the stool  Significant tenderness or worsening of abdominal pains  Swelling of the abdomen that is new, acute  Fever of 100F or higher  For urgent or emergent issues, a gastroenterologist can be reached at any hour by calling [phone]. Do not use MyChart messaging for urgent concerns.    DIET:  We do recommend a small meal at first, but then you may proceed to your regular diet.  Drink plenty of fluids but you should avoid  alcoholic beverages for 24 hours.  ACTIVITY:  You should plan to take it easy for the rest of today and you should NOT DRIVE or use heavy machinery until tomorrow (because of the sedation medicines used during the test).    FOLLOW UP: Our staff will call the number listed on your records the next business day following your procedure.  We will call around 7:15- 8:00 am to check on you and address any questions or concerns that you may have regarding the information given to you following your procedure. If we do not reach you, we will leave a message.  If you develop any symptoms (ie: fever, flu-like symptoms, shortness of breath, cough etc.) before then, please call [phone].  If you test positive for Covid 19 in the 2 weeks post procedure, please call and report this information to [place].    If any biopsies were taken you will be contacted by phone or by letter within the next 1-3 weeks.  Please call us at [phone] if you have not heard about the biopsies in 3 weeks.    SIGNATURES/CONFIDENTIALITY: You and/or your care partner have signed paperwork which will be entered into your electronic medical record.  These signatures attest to the fact that that the information above on your After Visit Summary has been reviewed and is understood.  Full responsibility of the confidentiality of this discharge information lies with you and/or your care-partner.

## 2021-07-29 NOTE — Progress Notes (Signed)
VSS, transported to PACU °

## 2021-07-29 NOTE — Op Note (Signed)
[name] Patient Name: Lindsay Parks Procedure Date: 07/29/2021 11:48 AM MRN: [id number] Endoscopist: Napoleon Form , MD Age: 66 Referring MD:  Date of Birth: Dec 09, 1955 Gender: Female Account #: [id number] Procedure:                Colonoscopy Indications:              Screening patient at increased risk: Family history                            of 1st-degree relative with colorectal cancer at                            age 24 years (or older), Colon cancer screening in                            patient at increased risk: Family history of                            colorectal cancer in multiple 2nd degree relatives Medicines:                Monitored Anesthesia Care Procedure:                Pre-Anesthesia Assessment:                           - Prior to the procedure, a History and Physical                            was performed, and patient medications and                            allergies were reviewed. The patient's tolerance of                            previous anesthesia was also reviewed. The risks                            and benefits of the procedure and the sedation                            options and risks were discussed with the patient.                            All questions were answered, and informed consent                            was obtained. Prior Anticoagulants: The patient has                            taken no previous anticoagulant or antiplatelet                            agents. ASA Grade Assessment: II - A patient with  mild systemic disease. After reviewing the risks                            and benefits, the patient was deemed in                            satisfactory condition to undergo the procedure.                           After obtaining informed consent, the colonoscope                            was passed under direct vision. Throughout the                            procedure,  the patient's blood pressure, pulse, and                            oxygen saturations were monitored continuously. The                            Olympus PCF-H190DL (#[id number]) Colonoscope was                            introduced through the anus and advanced to the the                            cecum, identified by appendiceal orifice and                            ileocecal valve. The colonoscopy was performed                            without difficulty. The patient tolerated the                            procedure well. The quality of the bowel                            preparation was excellent. The ileocecal valve,                            appendiceal orifice, and rectum were photographed. Scope In: 11:54:59 AM Scope Out: 12:06:44 PM Scope Withdrawal Time: 0 hours 7 minutes 48 seconds  Total Procedure Duration: 0 hours 11 minutes 45 seconds  Findings:                 The perianal and digital rectal examinations were                            normal.                           Scattered small-mouthed diverticula were found in  the sigmoid colon.                           Non-bleeding external and internal hemorrhoids were                            found during retroflexion. The hemorrhoids were                            small.                           The exam was otherwise without abnormality. Complications:            No immediate complications. Estimated Blood Loss:     Estimated blood loss was minimal. Impression:               - Diverticulosis in the sigmoid colon.                           - Non-bleeding external and internal hemorrhoids.                           - The examination was otherwise normal.                           - No specimens collected. Recommendation:           - Patient has a contact number available for                            emergencies. The signs and symptoms of potential                            delayed  complications were discussed with the                            patient. Return to normal activities tomorrow.                            Written discharge instructions were provided to the                            patient.                           - Resume previous diet.                           - Continue present medications.                           - Repeat colonoscopy in 5 years for surveillance. Napoleon Form, MD 07/29/2021 12:10:50 PM This report has been signed electronically.

## 2021-08-02 ENCOUNTER — Telehealth: Payer: Self-pay

## 2021-08-02 NOTE — Telephone Encounter (Signed)
Post procedure follow up call, no answer 

## 2021-12-28 LAB — HM MAMMOGRAPHY

## 2021-12-29 ENCOUNTER — Encounter: Payer: Self-pay | Admitting: Internal Medicine

## 2021-12-31 ENCOUNTER — Other Ambulatory Visit: Payer: Self-pay | Admitting: Internal Medicine

## 2022-01-14 ENCOUNTER — Encounter: Payer: Self-pay | Admitting: Internal Medicine

## 2022-08-02 ENCOUNTER — Other Ambulatory Visit: Payer: Self-pay | Admitting: Internal Medicine

## 2022-08-03 NOTE — Telephone Encounter (Signed)
Lov: 09/2019  Attempted to reach to see if can schedule an appt with Dr. Fabian Sharp. Left a detail message that she has not been seen with Dr. Fabian Sharp over 2 years. After 3 years, she would need to re-establish and currently provider is not taking new patient. To call us back when she can.

## 2022-08-18 ENCOUNTER — Encounter (INDEPENDENT_AMBULATORY_CARE_PROVIDER_SITE_OTHER): Payer: Self-pay

## 2022-09-20 NOTE — Progress Notes (Signed)
Chief Complaint  Patient presents with   Annual Exam    HPI: Patient  Lindsay Parks  67 y.o. comes in today for Preventive Health Care visit  Medications  all but  HRT is managed  by leslie brewington ( formerly [facility])  She has been on ozempic for over 6 mos  Hrt is still helpful. Lymph edema is problem getting some worse and  cannot fit shoe on if fully wrapped so has to adapt time in compression. Marland Kitchen Has had lab done in recent past  NA results today  Health Maintenance  Topic Date Due   COVID-19 Vaccine (7 - 2023-24 season) 10/26/2022   MAMMOGRAM  12/29/2022   Colonoscopy  07/30/2026   Pneumonia Vaccine 41+ Years old  Completed   INFLUENZA VACCINE  Completed   DEXA SCAN  Completed   Hepatitis C Screening  Completed   Zoster Vaccines- Shingrix  Completed   HPV VACCINES  Aged Out   DTaP/Tdap/Td  Discontinued   Health Maintenance Review LIFESTYLE:  Exercise:  limited  leg more of a problem  stiffness. Wrap makes hard to go out.  Tobacco/ETS: no Alcohol:  one or twice a month  Sugar beverages:splenda  drinks  Sleep:   doing ok  taking ambien down to 5 mg   ( was off during covid)  mom and now in facility  Drug use: no HH of  dog  hh of 1   hus mom in facility  Adls   altered from leg milroys  can't squat  for a while . May need some help with some physical aspects of adls   ROS:  GEN/ HEENT: No fever, significant weight changes sweats headaches vision problems hearing changes, CV/ PULM; No chest pain shortness of breath cough, syncope,edema  change in exercise tolerance. GI /GU: No adominal pain, vomiting, change in bowel habits. No blood in the stool.SKIN/HEME: ,no acute skin rashes suspicious lesions or bleeding. No lymphadenopathy, nodules, masses.  NEURO/ PSYCH:  No neurologic signs such as weakness numbness. No depression anxiety. IMM/ Allergy: No unusual infections.  Allergy .   REST of 12 system review negative except as per HPI   Past Medical History:   Diagnosis Date   Allergy    SEASONAL   Anxiety    Arthritis    Cataract    Depression    GERD (gastroesophageal reflux disease)    no longer since cholecystectomy   History of esophageal stricture    History of pancreatitis    Hx of colonic polyps    Lymph edema    Milroys disease  left more than right   Meningitis    PONV (postoperative nausea and vomiting)    UTI (lower urinary tract infection)     Past Surgical History:  Procedure Laterality Date   ANTERIOR CERVICAL DECOMP/DISCECTOMY FUSION N/A 03/16/2017   Procedure: ANTERIOR CERVICAL DISCECTOMY AND FUSION FOR DECOMPRESSION ONE  LEVEL CERVICAL SIX-SEVEN  RIGHT SIDED APPROACH;  Surgeon: Venita Lick, MD;  Location: [facility] OR;  Service: Orthopedics;  Laterality: N/A;   CERVICAL DISCECTOMY     c5 and c6   CHOLECYSTECTOMY     COLONOSCOPY  05/17/2011   Dr.Kaplan   ELBOW SURGERY     ESOPHAGOGASTRODUODENOSCOPY     FLEXIBLE SIGMOIDOSCOPY     PARTIAL HYSTERECTOMY  01/04/2003   left hydrosalpinx ovarian cyst   SHOULDER ARTHROSCOPY Left 12/2020   SHOULDER SURGERY     TOTAL ABDOMINAL HYSTERECTOMY      Family History  Problem Relation Age of Onset   Stroke Mother    Diabetes Mother    Hypertension Mother    Heart disease Mother    Osteoporosis Mother    Arthritis Mother    Hyperlipidemia Mother    Colon cancer Father        late 33's early 43's when dx   Glaucoma Father    Hypertension Father    Gout Father    Hyperlipidemia Father    Cancer Father    Colon cancer Paternal Aunt    Colon cancer Paternal Uncle    Kidney disease Paternal Uncle    Heart disease Maternal Grandmother    Colon cancer Paternal Grandmother        aunts and uncles   Pancreatic cancer Paternal Grandfather        lung, and stomach   Diabetes Other        grandson   Esophageal cancer Neg Hx    Stomach cancer Neg Hx    Rectal cancer Neg Hx     Social History   Socioeconomic History   Marital status: Married    Spouse name: Not on  file   Number of children: 2   Years of education: Not on file   Highest education level: Not on file  Occupational History   Occupation: retired  Tobacco Use   Smoking status: Never   Smokeless tobacco: Never  Vaping Use   Vaping status: Never Used  Substance and Sexual Activity   Alcohol use: Yes    Comment: socially-occ wine 1-2 times per month   Drug use: No   Sexual activity: Not on file  Other Topics Concern   Not on file  Social History Narrative   ** Merged History Encounter **       Regular exercise- yes Occupation: retired from [employer] Married husband with parkinson's 2 children care taking husband parkinson's  And  Elderly mom   Finished degree i   Social Determinants of Health   Financial Resource Strain: Low Risk  (09/22/2022)   Overall Financial Resource Strain (CARDIA)    Difficulty of Paying Living Expenses: Not hard at all  Food Insecurity: No Food Insecurity (09/22/2022)   Hunger Vital Sign    Worried About Running Out of Food in the Last Year: Never true    Ran Out of Food in the Last Year: Never true  Transportation Needs: No Transportation Needs (09/22/2022)   PRAPARE - Administrator, Civil Service (Medical): No    Lack of Transportation (Non-Medical): No  Physical Activity: Insufficiently Active (09/22/2022)   Exercise Vital Sign    Days of Exercise per Week: 1 day    Minutes of Exercise per Session: 60 min  Stress: Stress Concern Present (09/22/2022)   [employer] of Occupational Health - Occupational Stress Questionnaire    Feeling of Stress : To some extent  Social Connections: Moderately Integrated (09/22/2022)   Social Connection and Isolation Panel [NHANES]    Frequency of Communication with Friends and Family: Three times a week    Frequency of Social Gatherings with Friends and Family: Never    Attends Religious Services: More than 4 times per year    Active Member of Clubs or Organizations: Yes    Attends Tax inspector Meetings: More than 4 times per year    Marital Status: Widowed    Outpatient Medications Prior to Visit  Medication Sig Dispense Refill   calcium-vitamin D (OSCAL WITH D)  500-200 MG-UNIT per tablet Take 1 tablet by mouth daily.       diphenhydrAMINE (BENADRYL) 25 MG tablet Take 25 mg by mouth every 8 (eight) hours as needed for allergies (allergy headaches.).      ergocalciferol (VITAMIN D2) 1.25 MG (50000 UT) capsule Take 50,000 Units by mouth once a week.     estradiol (VIVELLE-DOT) 0.05 MG/24HR patch PLACE 1 PATCH ONTO THE SKIN 2 (TWO) TIMES A WEEK. 8 patch 6   fluocinonide (LIDEX) 0.05 % cream Apply 1 application topically 2 (two) times daily as needed (for eczema).      LORazepam (ATIVAN) 0.5 MG tablet Take 0.5 mg by mouth as needed.   0   OZEMPIC, 0.25 OR 0.5 MG/DOSE, 2 MG/1.5ML SOPN Inject 0.5 mg into the skin once a week.     QUEtiapine (SEROQUEL XR) 300 MG 24 hr tablet Take by mouth.     zolpidem (AMBIEN) 5 MG tablet Take 5 mg by mouth at bedtime.     Omega-3 Fatty Acids (FISH OIL PO) Take by mouth. (Patient not taking: Reported on 09/22/2022)     No facility-administered medications prior to visit.     EXAM:  BP 100/70 (BP Location: Right Arm, Patient Position: Sitting, Cuff Size: Large)   Pulse 92   Temp 98.1 F (36.7 C) (Oral)   Ht 5' 5.9" (1.674 m)   Wt 218 lb 3.2 oz (99 kg)   SpO2 98%   BMI 35.33 kg/m   Body mass index is 35.33 kg/m. Wt Readings from Last 3 Encounters:  09/22/22 218 lb 3.2 oz (99 kg)  07/29/21 217 lb (98.4 kg)  07/01/21 217 lb (98.4 kg)    Physical Exam: Vital signs reviewed WUJ:WJXB is a well-developed well-nourished alert cooperative    who appearsr stated age in no acute distress.  HEENT: normocephalic atraumatic , Eyes: PERRL EOM's full, conjunctiva clear, Nares: paten,t no deformity discharge or tenderness., Ears: no deformity EAC's clear TMs with normal landmarks. Mouth: clear OP, no lesions, edema.  Moist mucous membranes.  Dentition in adequate repair. NECK: supple without masses, thyromegaly or bruits. CHEST/PULM:  Clear to auscultation and percussion breath sounds equal no wheeze , rales or rhonchi. No chest wall deformities or tenderness. Breast: normal by inspection . No dimpling, discharge, masses, tenderness or discharge . CV: PMI is nondisplaced, S1 S2 no gallops, murmurs, rubs. Peripheral pulses are full without delay.No JVD .  ABDOMEN: Bowel sounds normal nontender  No guard or rebound, no hepato splenomegal no CVA tenderness.   Extremtities:  No clubbing cyanosis left lymphedema  more than right no  skin wounds  no ulces , no acute joint swelling or redness no focal atrophy NEURO:  Oriented x3, cranial nerves 3-12 appear to be intact, no obvious focal weakness,gait within normal limits no abnormal reflexes or asymmetrical SKIN: No acute rashes normal turgor, color, no bruising or petechiae. PSYCH: Oriented, good eye contact, no obvious depression anxiety, cognition and judgment appear normal. LN: no cervical axillary inguinal adenopathy  Lab Results  Component Value Date   WBC 5.4 09/23/2019   HGB 13.3 09/23/2019   HCT 40.6 09/23/2019   PLT 277 09/23/2019   GLUCOSE 92 09/22/2022   CHOL 232 (H) 09/22/2022   TRIG 88.0 09/22/2022   HDL 59.50 09/22/2022   LDLDIRECT 149.3 06/11/2012   LDLCALC 155 (H) 09/22/2022   ALT 15 09/22/2022   AST 17 09/22/2022   NA 142 09/22/2022   K 4.2 09/22/2022   CL 105 09/22/2022  CREATININE 1.17 09/22/2022   BUN 11 09/22/2022   CO2 30 09/22/2022   TSH 1.93 09/23/2019    BP Readings from Last 3 Encounters:  09/22/22 100/70  07/29/21 (!) 118/54  09/23/19 120/78    Lab plan reviewed with patient   ASSESSMENT AND PLAN:  Discussed the following assessment and plan:    ICD-10-CM   1. 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 Hormone replacement therapy (HRT)  Z79.890    benefit more than risk   to continue    4. Medication management  Z79.899 Comprehensive metabolic panel    Lipid panel    CANCELED: CBC with Differential/Platelet    CANCELED: TSH    5. Insomnia, unspecified type  G47.00 Comprehensive metabolic panel    Lipid panel    CANCELED: CBC with Differential/Platelet    CANCELED: TSH   chronic  meds per  orther proescriber    6. Need for hepatitis C screening test  Z11.59 Hep C Antibody    7. Hyperlipidemia, unspecified hyperlipidemia type  E78.5 Comprehensive metabolic panel    Lipid panel    Lipoprotein A (LPA)    CANCELED: CBC with Differential/Platelet    CANCELED: TSH    8. Need for vaccination for pneumococcus  Z23 Pneumococcal conjugate vaccine 20-valent (Prevnar 20)    Continue management milroys  .   Update labs  here today    Return in about 1 year (around 09/22/2023) for depending on results.  Patient Care Team: Amarri Michaelson, Neta Mends, MD as PCP - General [facility], Sharyne Peach (Nurse Practitioner) Toni Arthurs, MD as Consulting Physician (Orthopedic Surgery) Patient Instructions  Good to see  you today .  Prevnar 20 today .  Continue lifestyle intervention healthy eating and activity as possible. Lab today .   Neta Mends. Zebulin Siegel M.D.

## 2022-09-22 ENCOUNTER — Encounter: Payer: Self-pay | Admitting: Internal Medicine

## 2022-09-22 ENCOUNTER — Ambulatory Visit (INDEPENDENT_AMBULATORY_CARE_PROVIDER_SITE_OTHER): Payer: Federal, State, Local not specified - PPO | Admitting: Internal Medicine

## 2022-09-22 VITALS — BP 100/70 | HR 92 | Temp 98.1°F | Ht 65.9 in | Wt 218.2 lb

## 2022-09-22 DIAGNOSIS — Z79899 Other long term (current) drug therapy: Secondary | ICD-10-CM

## 2022-09-22 DIAGNOSIS — Z23 Encounter for immunization: Secondary | ICD-10-CM

## 2022-09-22 DIAGNOSIS — I890 Lymphedema, not elsewhere classified: Secondary | ICD-10-CM

## 2022-09-22 DIAGNOSIS — Z79890 Hormone replacement therapy (postmenopausal): Secondary | ICD-10-CM

## 2022-09-22 DIAGNOSIS — Z0000 Encounter for general adult medical examination without abnormal findings: Secondary | ICD-10-CM

## 2022-09-22 DIAGNOSIS — G4700 Insomnia, unspecified: Secondary | ICD-10-CM

## 2022-09-22 DIAGNOSIS — E785 Hyperlipidemia, unspecified: Secondary | ICD-10-CM | POA: Diagnosis not present

## 2022-09-22 DIAGNOSIS — Z1159 Encounter for screening for other viral diseases: Secondary | ICD-10-CM

## 2022-09-22 LAB — COMPREHENSIVE METABOLIC PANEL
ALT: 15 U/L (ref 0–35)
AST: 17 U/L (ref 0–37)
Albumin: 3.9 g/dL (ref 3.5–5.2)
Alkaline Phosphatase: 94 U/L (ref 39–117)
BUN: 11 mg/dL (ref 6–23)
CO2: 30 mEq/L (ref 19–32)
Calcium: 9.3 mg/dL (ref 8.4–10.5)
Chloride: 105 mEq/L (ref 96–112)
Creatinine, Ser: 1.17 mg/dL (ref 0.40–1.20)
GFR: 48.45 mL/min — ABNORMAL LOW (ref >60.00)
Glucose, Bld: 92 mg/dL (ref 70–99)
Potassium: 4.2 mEq/L (ref 3.5–5.1)
Sodium: 142 mEq/L (ref 135–145)
Total Bilirubin: 0.3 mg/dL (ref 0.2–1.2)
Total Protein: 6.5 g/dL (ref 6.0–8.3)

## 2022-09-22 LAB — LIPID PANEL
Cholesterol: 232 mg/dL — ABNORMAL HIGH (ref 0–200)
HDL: 59.5 mg/dL (ref >39.00)
LDL Cholesterol: 155 mg/dL — ABNORMAL HIGH (ref 0–99)
NonHDL: 172.68
Total CHOL/HDL Ratio: 4
Triglycerides: 88 mg/dL (ref 0.0–149.0)
VLDL: 17.6 mg/dL (ref 0.0–40.0)

## 2022-09-22 NOTE — Patient Instructions (Signed)
Good to see  you today .  Prevnar 20 today .  Continue lifestyle intervention healthy eating and activity as possible. Lab today .

## 2022-09-24 NOTE — Progress Notes (Signed)
Cholesterol up but improved  Gfr read as slightly decreased and should be followed .   Would repeat bmp gfr  and urine microalbumin ratio  in 2-3 months ( but if her other presciber Verlon Au brewington can do this and send info ok . The 10-year ASCVD risk score (Arnett DK, et al., 2019) is: 4.5%   Values used to calculate the score:     Age: 67 years     Sex: Female     Is Non-Hispanic African American: No     Diabetic: No     Tobacco smoker: No     Systolic Blood Pressure: 100 mmHg     Is BP treated: No     HDL Cholesterol: 59.5 mg/dL     Total Cholesterol: 232 mg/dL

## 2022-09-26 ENCOUNTER — Other Ambulatory Visit: Payer: Self-pay

## 2022-09-26 DIAGNOSIS — E785 Hyperlipidemia, unspecified: Secondary | ICD-10-CM

## 2022-09-26 DIAGNOSIS — R944 Abnormal results of kidney function studies: Secondary | ICD-10-CM

## 2022-09-26 DIAGNOSIS — Z79899 Other long term (current) drug therapy: Secondary | ICD-10-CM

## 2022-09-26 LAB — LIPOPROTEIN A (LPA): Lipoprotein (a): 110 nmol/L — ABNORMAL HIGH (ref <75)

## 2022-09-26 LAB — HEPATITIS C ANTIBODY: Hepatitis C Ab: NONREACTIVE

## 2022-10-03 NOTE — Progress Notes (Signed)
Lipo a is somewhat elevated (  vascular risk factor )   consider adding statin medication to bring down ldl in addition to  diet attention  ( avoid processed foods and focus on plant based )  we can do a  virtual visit to discuss and or  you can discuss this with  Venia Minks

## 2022-10-26 ENCOUNTER — Telehealth (INDEPENDENT_AMBULATORY_CARE_PROVIDER_SITE_OTHER): Payer: Federal, State, Local not specified - PPO | Admitting: Internal Medicine

## 2022-10-26 ENCOUNTER — Encounter: Payer: Self-pay | Admitting: Internal Medicine

## 2022-10-26 VITALS — Ht 65.9 in | Wt 209.0 lb

## 2022-10-26 DIAGNOSIS — Z79899 Other long term (current) drug therapy: Secondary | ICD-10-CM

## 2022-10-26 DIAGNOSIS — E785 Hyperlipidemia, unspecified: Secondary | ICD-10-CM

## 2022-10-26 DIAGNOSIS — I890 Lymphedema, not elsewhere classified: Secondary | ICD-10-CM

## 2022-10-26 DIAGNOSIS — Z9189 Other specified personal risk factors, not elsewhere classified: Secondary | ICD-10-CM

## 2022-10-26 DIAGNOSIS — R944 Abnormal results of kidney function studies: Secondary | ICD-10-CM

## 2022-10-26 NOTE — Progress Notes (Signed)
Future orders for  bmp urine protein and lipid panel for December . Now is on  zetia  Also ct calcium score has been ordered in interim

## 2022-10-26 NOTE — Progress Notes (Signed)
Virtual Visit via Video Note  I connected with Lindsay Parks on 10/26/22 at  3:15 PM EDT by a video enabled telemedicine application and verified that I am speaking with the correct person using two identifiers. Location patient: home Location provider:work  office Persons participating in the virtual visit: patient, provider   Patient aware  of the limitations of evaluation and management by telemedicine and  availability of in person appointments. and agreed to proceed.   HPI: Lindsay Parks presents for video visit fu lipid levesl and intervnetion. Dr Lajoyce Corners ton began zetia for her and she is tolerating so far . No new sx . Began last month  See past result note ROS: See pertinent positives and negatives per HPI.  Past Medical History:  Diagnosis Date   Allergy    SEASONAL   Anxiety    Arthritis    Cataract    Depression    GERD (gastroesophageal reflux disease)    no longer since cholecystectomy   History of esophageal stricture    History of pancreatitis    Hx of colonic polyps    Lymph edema    Milroys disease  left more than right   Meningitis    PONV (postoperative nausea and vomiting)    UTI (lower urinary tract infection)     Past Surgical History:  Procedure Laterality Date   ANTERIOR CERVICAL DECOMP/DISCECTOMY FUSION N/A 03/16/2017   Procedure: ANTERIOR CERVICAL DISCECTOMY AND FUSION FOR DECOMPRESSION ONE  LEVEL CERVICAL SIX-SEVEN  RIGHT SIDED APPROACH;  Surgeon: Venita Lick, MD;  Location: [facility] OR;  Service: Orthopedics;  Laterality: N/A;   CERVICAL DISCECTOMY     c5 and c6   CHOLECYSTECTOMY     COLONOSCOPY  05/17/2011   Dr.Kaplan   ELBOW SURGERY     ESOPHAGOGASTRODUODENOSCOPY     FLEXIBLE SIGMOIDOSCOPY     PARTIAL HYSTERECTOMY  01/04/2003   left hydrosalpinx ovarian cyst   SHOULDER ARTHROSCOPY Left 12/2020   SHOULDER SURGERY     TOTAL ABDOMINAL HYSTERECTOMY      Family History  Problem Relation Age of Onset   Stroke Mother    Diabetes  Mother    Hypertension Mother    Heart disease Mother    Osteoporosis Mother    Arthritis Mother    Hyperlipidemia Mother    Colon cancer Father        late 61's early 35's when dx   Glaucoma Father    Hypertension Father    Gout Father    Hyperlipidemia Father    Cancer Father    Colon cancer Paternal Aunt    Colon cancer Paternal Uncle    Kidney disease Paternal Uncle    Heart disease Maternal Grandmother    Colon cancer Paternal Grandmother        aunts and uncles   Pancreatic cancer Paternal Grandfather        lung, and stomach   Diabetes Other        grandson   Esophageal cancer Neg Hx    Stomach cancer Neg Hx    Rectal cancer Neg Hx     Social History   Tobacco Use   Smoking status: Never   Smokeless tobacco: Never  Vaping Use   Vaping status: Never Used  Substance Use Topics   Alcohol use: Yes    Comment: socially-occ wine 1-2 times per month   Drug use: No      Current Outpatient Medications:    calcium-vitamin D (OSCAL WITH D)  500-200 MG-UNIT per tablet, Take 1 tablet by mouth daily.  , Disp: , Rfl:    diphenhydrAMINE (BENADRYL) 25 MG tablet, Take 25 mg by mouth every 8 (eight) hours as needed for allergies (allergy headaches.). , Disp: , Rfl:    ergocalciferol (VITAMIN D2) 1.25 MG (50000 UT) capsule, Take 50,000 Units by mouth once a week., Disp: , Rfl:    estradiol (VIVELLE-DOT) 0.05 MG/24HR patch, PLACE 1 PATCH ONTO THE SKIN 2 (TWO) TIMES A WEEK., Disp: 8 patch, Rfl: 6   ezetimibe (ZETIA) 10 MG tablet, Take 10 mg by mouth at bedtime., Disp: , Rfl:    fluocinonide (LIDEX) 0.05 % cream, Apply 1 application topically 2 (two) times daily as needed (for eczema). , Disp: , Rfl:    LORazepam (ATIVAN) 0.5 MG tablet, Take 0.5 mg by mouth as needed. , Disp: , Rfl: 0   OZEMPIC, 0.25 OR 0.5 MG/DOSE, 2 MG/1.5ML SOPN, Inject 0.5 mg into the skin once a week., Disp: , Rfl:    QUEtiapine (SEROQUEL XR) 300 MG 24 hr tablet, Take by mouth., Disp: , Rfl:    zolpidem  (AMBIEN) 5 MG tablet, Take 5 mg by mouth at bedtime., Disp: , Rfl:    Omega-3 Fatty Acids (FISH OIL PO), Take by mouth. (Patient not taking: Reported on 09/22/2022), Disp: , Rfl:   EXAM: BP Readings from Last 3 Encounters:  09/22/22 100/70  07/29/21 (!) 118/54  09/23/19 120/78    VITALS per patient if applicable:  GENERAL: alert, oriented, appears well and in no acute distress  HEENT: atraumatic, conjunttiva clear, no obvious abnormalities on inspection of external nose and ears  NECK: normal movements of the head and neck   PSYCH/NEURO: pleasant and cooperative, no obvious depression or anxiety, speech and thought processing grossly intact Lab Results  Component Value Date   WBC 5.4 09/23/2019   HGB 13.3 09/23/2019   HCT 40.6 09/23/2019   PLT 277 09/23/2019   GLUCOSE 92 09/22/2022   CHOL 232 (H) 09/22/2022   TRIG 88.0 09/22/2022   HDL 59.50 09/22/2022   LDLDIRECT 149.3 06/11/2012   LDLCALC 155 (H) 09/22/2022   ALT 15 09/22/2022   AST 17 09/22/2022   NA 142 09/22/2022   K 4.2 09/22/2022   CL 105 09/22/2022   CREATININE 1.17 09/22/2022   BUN 11 09/22/2022   CO2 30 09/22/2022   TSH 1.93 09/23/2019    ASSESSMENT AND PLAN:  Discussed the following assessment and plan:    ICD-10-CM   1. Hyperlipidemia, unspecified hyperlipidemia type  E78.5 CT CARDIAC SCORING (SELF PAY ONLY)    2. Medication management  Z79.899 CT CARDIAC SCORING (SELF PAY ONLY)    3. Decreased GFR  R94.4     4. Lymphedema  I89.0     5. Cardiovascular risk factor  Z91.89 CT CARDIAC SCORING (SELF PAY ONLY)    Disc options  Ct calcium scan  disc and ordered  mod risk  not a treadmill candidate  Update labs in December  to include lipid panel bmp and urine microalbumin .  If all ok then go from there  Counseled.   Expectant management and discussion of plan and treatment with opportunity to ask questions and all were answered. The patient agreed with the plan and demonstrated an understanding of  the instructions.   Advised to call back or seek an in-person evaluation if worsening  or having  further concerns  in interim. Return for lab in Decmber fasting preferred .  Berniece Andreas, MD

## 2022-11-28 ENCOUNTER — Other Ambulatory Visit (INDEPENDENT_AMBULATORY_CARE_PROVIDER_SITE_OTHER): Payer: Federal, State, Local not specified - PPO

## 2022-11-28 DIAGNOSIS — Z9189 Other specified personal risk factors, not elsewhere classified: Secondary | ICD-10-CM

## 2022-11-28 DIAGNOSIS — Z79899 Other long term (current) drug therapy: Secondary | ICD-10-CM | POA: Diagnosis not present

## 2022-11-28 DIAGNOSIS — E785 Hyperlipidemia, unspecified: Secondary | ICD-10-CM | POA: Diagnosis not present

## 2022-11-28 DIAGNOSIS — R944 Abnormal results of kidney function studies: Secondary | ICD-10-CM

## 2022-11-28 LAB — LIPID PANEL
Cholesterol: 229 mg/dL — ABNORMAL HIGH (ref 0–200)
HDL: 55 mg/dL (ref >39.00)
LDL Cholesterol: 161 mg/dL — ABNORMAL HIGH (ref 0–99)
NonHDL: 174.37
Total CHOL/HDL Ratio: 4
Triglycerides: 67 mg/dL (ref 0.0–149.0)
VLDL: 13.4 mg/dL (ref 0.0–40.0)

## 2022-11-29 LAB — MICROALBUMIN / CREATININE URINE RATIO
Creatinine,U: 230.4 mg/dL
Microalb Creat Ratio: 0.6 mg/g (ref 0.0–30.0)
Microalb, Ur: 1.3 mg/dL (ref 0.0–1.9)

## 2022-11-29 LAB — BASIC METABOLIC PANEL WITH GFR
BUN/Creatinine Ratio: 9 (calc) (ref 6–22)
BUN: 11 mg/dL (ref 7–25)
CO2: 24 mmol/L (ref 20–32)
Calcium: 9.4 mg/dL (ref 8.6–10.4)
Chloride: 105 mmol/L (ref 98–110)
Creat: 1.24 mg/dL — ABNORMAL HIGH (ref 0.50–1.05)
Glucose, Bld: 87 mg/dL (ref 65–99)
Potassium: 4.5 mmol/L (ref 3.5–5.3)
Sodium: 140 mmol/L (ref 135–146)
eGFR: 48 mL/min/{1.73_m2} — ABNORMAL LOW (ref >=60)

## 2022-11-30 ENCOUNTER — Ambulatory Visit (HOSPITAL_BASED_OUTPATIENT_CLINIC_OR_DEPARTMENT_OTHER)
Admission: RE | Admit: 2022-11-30 | Discharge: 2022-11-30 | Disposition: A | Discharge status: Home / Self Care | Payer: Federal, State, Local not specified - PPO | Source: Ambulatory Visit | Attending: Internal Medicine | Admitting: Internal Medicine

## 2022-11-30 DIAGNOSIS — Z9189 Other specified personal risk factors, not elsewhere classified: Secondary | ICD-10-CM | POA: Insufficient documentation

## 2022-11-30 DIAGNOSIS — E785 Hyperlipidemia, unspecified: Secondary | ICD-10-CM | POA: Insufficient documentation

## 2022-11-30 DIAGNOSIS — Z79899 Other long term (current) drug therapy: Secondary | ICD-10-CM | POA: Insufficient documentation

## 2022-12-13 NOTE — Progress Notes (Signed)
Kidney function is off but no excess protein . I advise  bmp,  cy statin C in 3 months  dx decreased egfr

## 2022-12-13 NOTE — Progress Notes (Signed)
Ct calcium score of 0 is favorable   . Can hold on medication for cholesterol  Kidney function is off but no excess protein . I advise  bmp,  cy statin C in 3 months  dx decreased egfr

## 2022-12-30 LAB — HM MAMMOGRAPHY

## 2023-02-13 ENCOUNTER — Ambulatory Visit: Payer: Federal, State, Local not specified - PPO | Admitting: Family Medicine

## 2023-02-13 ENCOUNTER — Encounter: Payer: Self-pay | Admitting: Family Medicine

## 2023-02-13 VITALS — BP 120/66 | HR 102 | Temp 98.9°F | Ht 65.0 in | Wt 207.4 lb

## 2023-02-13 DIAGNOSIS — R059 Cough, unspecified: Secondary | ICD-10-CM

## 2023-02-13 DIAGNOSIS — R6889 Other general symptoms and signs: Secondary | ICD-10-CM | POA: Diagnosis not present

## 2023-02-13 LAB — POC COVID19 BINAXNOW: SARS Coronavirus 2 Ag: NEGATIVE

## 2023-02-13 LAB — POCT INFLUENZA A/B
Influenza A, POC: POSITIVE — AB
Influenza B, POC: NEGATIVE

## 2023-02-13 MED ORDER — BENZONATATE 100 MG PO CAPS: 100.0000 mg | ORAL_CAPSULE | Freq: Two times a day (BID) | ORAL | 0 refills | Status: AC | PRN

## 2023-02-13 NOTE — Progress Notes (Signed)
 Phone [phone] In person visit   Subjective:   Lindsay Parks is a 68 y.o. year old very pleasant female patient who presents for/with See problem oriented charting Chief Complaint  Patient presents with   Nasal Congestion    Nasal and chest congestion started last Wednesday    Past Medical History-  Patient Active Problem List   Diagnosis Date Noted   S/P cervical spinal fusion 03/16/2017   Hx of cervical spine surgery 02/06/2014   Acquired deformity of neck 06/11/2013   Left Achilles tendinitis? 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 20 capsule 0   calcium-vitamin D (OSCAL WITH D) 500-200 MG-UNIT per tablet Take 1 tablet by mouth daily.       diphenhydrAMINE (BENADRYL) 25 MG tablet Take 25 mg by mouth every 8 (eight) hours as needed for allergies (allergy headaches.).      estradiol  (VIVELLE -DOT) 0.05 MG/24HR patch PLACE 1 PATCH ONTO THE SKIN 2 (TWO) TIMES A WEEK. 8 patch 6   ezetimibe (ZETIA) 10 MG tablet Take 10 mg by mouth at bedtime.     fluocinonide (LIDEX) 0.05 % cream Apply 1 application topically 2 (two) times daily as needed (for eczema).      LORazepam   (ATIVAN ) 0.5 MG tablet Take 0.5 mg by mouth as needed.   0   Omega-3 Fatty Acids (FISH OIL PO) Take by mouth. (Patient not taking: Reported on 09/22/2022)     OZEMPIC, 0.25 OR 0.5 MG/DOSE, 2 MG/1.5ML SOPN Inject 0.5 mg into the skin once a week.     QUEtiapine (SEROQUEL XR) 300 MG 24 hr tablet Take by mouth.     zolpidem  (AMBIEN ) 5 MG tablet Take 5 mg by mouth at bedtime.     No current facility-administered medications for this visit.     Objective:  BP 120/66 (BP Location: Right Arm, Patient Position: Sitting)   Pulse (!) 102   Temp 98.9 F (37.2 C) (Temporal)   Ht 5\' 5"  (1.651 m)   Wt 207 lb 6.4 oz (94.1 kg)   SpO2 100%   BMI 34.51 kg/m  Gen: NAD, resting comfortably Tympanic membrane's normal bilaterally, nasal turbinates erythematous and edematous but without discharge, no sinus tenderness noted CV: Mildly tachycardic but regular no murmurs rubs or gallops Lungs: CTAB no crackles, wheeze, rhonchi Ext: Trace edema on right, chronic lymphedema on the left leg-baseline per patient Skin: warm, dry  Results for orders placed or performed in visit on 02/13/23 (from the past 24 hours)  POC COVID-19 BinaxNow     Status: None   Collection Time: 02/13/23 12:01 PM  Result Value Ref Range   SARS Coronavirus 2 Ag Negative Negative  POCT  Influenza A/B     Status: Abnormal   Collection Time: 02/13/23 12:01 PM  Result Value Ref Range   Influenza A, POC Positive (A) Negative   Influenza B, POC Negative Negative       Assessment and Plan   # cough/congestion S:day 6 of symptoms today including nasal and chest congestion. No fever. Mild shortness of breath. Hydrating aggressively but low appetite. Having body aches with it. Bad cough and has chest discomfort with it. No exertional chest pain.  Worsened throughout Saturday but stable now - she has been vaccinated in August.  -some benadryl -tylenol  at night A/P: Cough and congestion now on day 6 and positive for influenza A . Too late  for tamiflu. She did have some worsening in to Saturday but now stable- I asked her if worsens again (particularly fever or shortness of breath)  or not significantly improved by next Monday to let us  reevaluate her. Trial tessalon  for cough.  -Heart rate was regular but tachycardic-likely reflects her illness but asked her to keep an eye on this-she has monitor at home and if not trending down over the next week as her illness improves to see us  back.  Encouraged continued aggressive hydration.  No new onset edema or calf pain-doubt pulmonary embolism and lungs are clear-doubt pneumonia but would consider x-ray if fails to improve   Recommended follow up: Return for as needed for new, worsening, persistent symptoms.   Lab/Order associations:   ICD-10-CM   1. Cough, unspecified type  R05.9 POC COVID-19 BinaxNow    2. Flu-like symptoms  R68.89 POCT Influenza A/B      Meds ordered this encounter  Medications   benzonatate  (TESSALON ) 100 MG capsule    Sig: Take 1 capsule (100 mg total) by mouth 2 (two) times daily as needed for cough.    Dispense:  20 capsule    Refill:  0    Return precautions advised.  Clarisa Crooked, MD

## 2023-02-13 NOTE — Patient Instructions (Addendum)
 Cough and congestion now on day 6 and positive for influenza A.  Too late for tamiflu. She did have some worsening in to Saturday but now stable- I asked her if worsens again (particularly fever or shortness of breath)  or not significantly improved by next Monday to let us  reevaluate her. Trial tessalon  for cough.   I agree with staying away from your mom for now until this clears  Recommended follow up: Return for as needed for new, worsening, persistent symptoms.

## 2023-03-12 ENCOUNTER — Other Ambulatory Visit: Payer: Self-pay | Admitting: Internal Medicine

## 2023-07-11 LAB — HEPATIC FUNCTION PANEL
ALT: 17 U/L (ref 7–35)
AST: 28 (ref 13–35)

## 2023-07-11 LAB — LIPID PANEL
Cholesterol: 239 — AB (ref 0–200)
HDL: 67 (ref 35–70)
LDL Cholesterol: 160
Triglycerides: 72 (ref 40–160)

## 2023-07-11 LAB — CBC AND DIFFERENTIAL
HCT: 40 (ref 36–46)
Hemoglobin: 13.3 (ref 12.0–16.0)
WBC: 5.2

## 2023-07-11 LAB — TSH
A1c: 5.7
EGFR: 54
TSH: 1.27 (ref 0.41–5.90)

## 2023-07-11 LAB — BASIC METABOLIC PANEL WITH GFR
BUN: 10 (ref 4–21)
CO2: 20 (ref 13–22)
Chloride: 104 (ref 99–108)
Creatinine: 1.1 (ref 0.5–1.1)
Glucose: 88

## 2023-07-11 LAB — COMPREHENSIVE METABOLIC PANEL WITH GFR
Albumin: 4 (ref 3.5–5.0)
Calcium: 9.3 (ref 8.7–10.7)
eGFR: 54

## 2023-07-11 LAB — CBC: RBC: 4.53 (ref 3.87–5.11)

## 2023-07-11 LAB — HEMOGLOBIN A1C: Hemoglobin A1C: 5.7

## 2023-08-08 ENCOUNTER — Telehealth: Payer: Self-pay | Admitting: Internal Medicine

## 2023-08-08 NOTE — Telephone Encounter (Unsigned)
 Copied from CRM #[id number]. Topic: Referral - Question >> Aug 08, 2023  4:12 PM Gennette ORN wrote: Reason for CRM: Patient is asking for a referral she stated they have discussed this before. [phone].

## 2023-08-10 NOTE — Telephone Encounter (Signed)
 Im ok with lymphedema referral if she has a specifics about referral  Otherwise   disc at appt planned

## 2023-08-15 ENCOUNTER — Encounter: Payer: Self-pay | Admitting: Internal Medicine

## 2023-08-15 ENCOUNTER — Telehealth: Admitting: Internal Medicine

## 2023-08-15 ENCOUNTER — Other Ambulatory Visit: Payer: Self-pay | Admitting: Internal Medicine

## 2023-08-15 VITALS — Ht 65.0 in | Wt 208.0 lb

## 2023-08-15 DIAGNOSIS — Z79890 Hormone replacement therapy (postmenopausal): Secondary | ICD-10-CM

## 2023-08-15 DIAGNOSIS — E785 Hyperlipidemia, unspecified: Secondary | ICD-10-CM

## 2023-08-15 DIAGNOSIS — R232 Flushing: Secondary | ICD-10-CM

## 2023-08-15 DIAGNOSIS — G4700 Insomnia, unspecified: Secondary | ICD-10-CM

## 2023-08-15 DIAGNOSIS — Z79899 Other long term (current) drug therapy: Secondary | ICD-10-CM

## 2023-08-15 DIAGNOSIS — Q820 Hereditary lymphedema: Secondary | ICD-10-CM | POA: Diagnosis not present

## 2023-08-15 DIAGNOSIS — R944 Abnormal results of kidney function studies: Secondary | ICD-10-CM

## 2023-08-15 NOTE — Progress Notes (Signed)
 Virtual Visit via Video Note  I connected with Lindsay Parks on 08/15/23 at  3:30 PM EDT by a video enabled telemedicine application and verified that I am speaking with the correct person using two identifiers. Location patient: home Location provider:work office Persons participating in the virtual visit: patient, provider   Patient aware  of the limitations of evaluation and management by telemedicine and  availability of in person appointments. and agreed to proceed.   HPI: Lindsay Parks presents for video visit  Night sweats every night  despite changes  in environment  Hot flushes in day some now  Began 2 months ago  no major changes in meds  Gained weight back after off ozempic   Less activity  leg and feet some sore area  Wants a referral to hyphedonia  help.  Dr Oris in past  their practice may not take her insurance .   New supplement tumeric  a month ago  2 oz 9 for a year)  Also calcium 1200 mg  Magnesium  otc  Fish  oil   Generally no sugar but did some  recently  Fall about 3 weeks ago  and had knees x ray and sore . [facility]/.  Given meloxicam for pain  meloxicam  for 30 days  feeling better  except tender.  Recent dental implant .    Had dental pulls  Has ha d some labs that need ffu in another month  ROS: See pertinent positives and negatives per HPI. No cv sx  less activity  cause of leg  harder to do  hh chores up keep .   Past Medical History:  Diagnosis Date   Allergy    SEASONAL   Anxiety    Arthritis    Cataract    Depression    GERD (gastroesophageal reflux disease)    no longer since cholecystectomy   History of esophageal stricture    History of pancreatitis    Hx of colonic polyps    Lymph edema    Milroys disease  left more than right   Meningitis    PONV (postoperative nausea and vomiting)    UTI (lower urinary tract infection)     Past Surgical History:  Procedure Laterality Date   ANTERIOR CERVICAL DECOMP/DISCECTOMY FUSION  N/A 03/16/2017   Procedure: ANTERIOR CERVICAL DISCECTOMY AND FUSION FOR DECOMPRESSION ONE  LEVEL CERVICAL SIX-SEVEN  RIGHT SIDED APPROACH;  Surgeon: Burnetta Aures, MD;  Location: [facility] OR;  Service: Orthopedics;  Laterality: N/A;   CERVICAL DISCECTOMY     c5 and c6   CHOLECYSTECTOMY     COLONOSCOPY  05/17/2011   Dr.Kaplan   ELBOW SURGERY     ESOPHAGOGASTRODUODENOSCOPY     FLEXIBLE SIGMOIDOSCOPY     PARTIAL HYSTERECTOMY  01/04/2003   left hydrosalpinx ovarian cyst   SHOULDER ARTHROSCOPY Left 12/2020   SHOULDER SURGERY     TOTAL ABDOMINAL HYSTERECTOMY      Family History  Problem Relation Age of Onset   Stroke Mother    Diabetes Mother    Hypertension Mother    Heart disease Mother    Osteoporosis Mother    Arthritis Mother    Hyperlipidemia Mother    Colon cancer Father        late 36's early 101's when dx   Glaucoma Father    Hypertension Father    Gout Father    Hyperlipidemia Father    Cancer Father    Colon cancer Paternal Aunt  Colon cancer Paternal Uncle    Kidney disease Paternal Uncle    Heart disease Maternal Grandmother    Colon cancer Paternal Grandmother        aunts and uncles   Pancreatic cancer Paternal Grandfather        lung, and stomach   Diabetes Other        grandson   Esophageal cancer Neg Hx    Stomach cancer Neg Hx    Rectal cancer Neg Hx     Social History   Tobacco Use   Smoking status: Never   Smokeless tobacco: Never  Vaping Use   Vaping status: Never Used  Substance Use Topics   Alcohol use: Yes    Comment: socially-occ wine 1-2 times per month   Drug use: No      Current Outpatient Medications:    calcium-vitamin D (OSCAL WITH D) 500-200 MG-UNIT per tablet, Take 1 tablet by mouth daily.  , Disp: , Rfl:    diphenhydrAMINE (BENADRYL) 25 MG tablet, Take 25 mg by mouth every 8 (eight) hours as needed for allergies (allergy headaches.). , Disp: , Rfl:    estradiol  (VIVELLE -DOT) 0.05 MG/24HR patch, PLACE 1 PATCH ONTO THE SKIN 2  TIMES A WEEK., Disp: 24 patch, Rfl: 3   ezetimibe (ZETIA) 10 MG tablet, Take 10 mg by mouth at bedtime., Disp: , Rfl:    fluocinonide (LIDEX) 0.05 % cream, Apply 1 application topically 2 (two) times daily as needed (for eczema). , Disp: , Rfl:    LORazepam  (ATIVAN ) 0.5 MG tablet, Take 0.5 mg by mouth as needed. , Disp: , Rfl: 0   OZEMPIC, 0.25 OR 0.5 MG/DOSE, 2 MG/1.5ML SOPN, Inject 0.5 mg into the skin once a week. (Patient taking differently: Inject 0.5 mg into the skin once a week. Taking 2mg .), Disp: , Rfl:    pravastatin (PRAVACHOL) 10 MG tablet, Take 10 mg by mouth at bedtime., Disp: , Rfl:    QUEtiapine (SEROQUEL XR) 300 MG 24 hr tablet, Take by mouth., Disp: , Rfl:    zolpidem  (AMBIEN ) 5 MG tablet, Take 5 mg by mouth at bedtime., Disp: , Rfl:    benzonatate  (TESSALON ) 100 MG capsule, Take 1 capsule (100 mg total) by mouth 2 (two) times daily as needed for cough. (Patient not taking: Reported on 08/15/2023), Disp: 20 capsule, Rfl: 0   Omega-3 Fatty Acids (FISH OIL PO), Take by mouth. (Patient not taking: Reported on 08/15/2023), Disp: , Rfl:   EXAM: BP Readings from Last 3 Encounters:  02/13/23 120/66  09/22/22 100/70  07/29/21 (!) 118/54    VITALS per patient if applicable:  GENERAL: alert, oriented, appears well and in no acute distress  HEENT: atraumatic, conjunttiva clear, no obvious abnormalities on inspection of external nose and ears  NECK: normal movements of the head and neck  LUNGS: on inspection no signs of respiratory distress, breathing rate appears normal, no obvious gross SOB, gasping or wheezing  CV: no obvious cyanosis  MS: moves all visible extremities without noticeable abnormality  PSYCH/NEURO: pleasant and cooperative, no obvious depression or anxiety, speech and thought processing grossly intact Lab Results  Component Value Date   WBC 5.4 09/23/2019   HGB 13.3 09/23/2019   HCT 40.6 09/23/2019   PLT 277 09/23/2019   GLUCOSE 87 11/28/2022   CHOL 229  (H) 11/28/2022   TRIG 67.0 11/28/2022   HDL 55.00 11/28/2022   LDLDIRECT 149.3 06/11/2012   LDLCALC 161 (H) 11/28/2022   ALT 15 09/22/2022  AST 17 09/22/2022   NA 140 11/28/2022   K 4.5 11/28/2022   CL 105 11/28/2022   CREATININE 1.24 (H) 11/28/2022   BUN 11 11/28/2022   CO2 24 11/28/2022   TSH 1.93 09/23/2019   Blood work  Please fax lab results of last labs ? June   and to repeat in next 4 weeks  ASSESSMENT AND PLAN:  Discussed the following assessment and plan:    ICD-10-CM   1. Hot flashes  R23.2    inc over last 2 mos depote mitigation efforts    2. MILROY'S DISEASE  Q82.0     3. Medication management  Z79.899     4. Insomnia, unspecified type  G47.00     5. Hormone replacement therapy (HRT)  Z79.890     Uncomfortable    before inc patch hrt  estradiol  will check level and thryoid etc   Consider  inc to 0.75 erst patch  but would like more info  baseline  not sure why now   Plan stop non crucial uppl;ements  mag ok  No reg meloxicam  And then  go from there.  In regard to milroys lymphedema  disc options  rehab in [place]  vv teams was told not in network   Asked her to look into this  and we can od a referral for her hereditary lymphedema  Counseled.   Expectant management and discussion of plan and treatment with opportunity to ask questions and all were answered. The patient agreed with the plan and demonstrated an understanding of the instructions.  I personally spent a total of 34 minutes in the care of the patient today including getting/reviewing separately obtained history, counseling and educating, placing orders, documenting clinical information in the EHR, and decision making .  Advised to call back or seek an in-person evaluation if worsening  or having  further concerns  in interim. Return for depending on results.    Apolinar Eastern, MD

## 2023-08-15 NOTE — Progress Notes (Signed)
 Future orders

## 2023-08-16 ENCOUNTER — Other Ambulatory Visit

## 2023-08-17 ENCOUNTER — Telehealth: Payer: Self-pay | Admitting: Internal Medicine

## 2023-08-17 DIAGNOSIS — I890 Lymphedema, not elsewhere classified: Secondary | ICD-10-CM

## 2023-08-17 DIAGNOSIS — Q820 Hereditary lymphedema: Secondary | ICD-10-CM

## 2023-08-17 NOTE — Telephone Encounter (Signed)
 Copied from CRM #[id number]. Topic: Referral - Request for Referral >> Aug 17, 2023  3:17 PM Jasmin G wrote: Did the patient discuss referral with their provider in the last year? Yes (If No - schedule appointment) (If Yes - send message)  Appointment offered? No  Type of order/referral and detailed reason for visit: Lipedema treatment  Preference of office, provider, location:  [facility]  If referral order, have you been seen by this specialty before? No (If Yes, this issue or another issue? When? Where?  Can we respond through MyChart? Yes

## 2023-08-21 ENCOUNTER — Encounter: Payer: Self-pay | Admitting: Internal Medicine

## 2023-08-21 NOTE — Telephone Encounter (Signed)
 Spoke to pt and inform her that the referral was sent. Also that someone will contact her to set up an appt.   Pt states she had drop off lab result from July and would like Dr. Charlett to review it. Inform her we have received the lab and will forwarded to Dr. Charlett.   Lab placed in provider's green folder.

## 2023-08-29 ENCOUNTER — Ambulatory Visit: Payer: Self-pay

## 2023-08-29 NOTE — Telephone Encounter (Signed)
 FYI Only or Action Required?: FYI only for provider.  Patient was last seen in primary care on 08/15/2023 by Panosh, Apolinar POUR, MD.  Called Nurse Triage reporting Knee Pain.  Symptoms began several weeks ago.  Interventions attempted: Prescription medications: meloxicam and Rest, hydration, or home remedies.  Symptoms are: gradually worsening.  Triage Disposition: See PCP When Office is Open (Within 3 Days)  Patient/caregiver understands and will follow disposition?: Yes                             Copied from CRM #[id number]. Topic: Clinical - Red Word Triage >> Aug 29, 2023  4:02 PM Martinique E wrote: [facility] that prompted transfer to Nurse Triage: Knee pain. Patient stated she fell about 5-6 weeks, followed up with [facility] and patient is still having knee pain, specifically left knee pain, rated pain 7 out of 10. Reason for Disposition  [1] MODERATE pain (e.g., interferes with normal activities, limping) AND [2] present > 3 days  Answer Assessment - Initial Assessment Questions 1. LOCATION and RADIATION: Where is the pain located?      Left knee 2. QUALITY: What does the pain feel like?  (e.g., sharp, dull, aching, burning)     Achy and itching 3. SEVERITY: How bad is the pain? What does it keep you from doing?   (Scale 1-10; or mild, moderate, severe)     Rates pain a 7  4. ONSET: When did the pain start? Does it come and go, or is it there all the time?     States she fell mid-July 6. SETTING: Has there been any recent work, exercise or other activity that involved that part of the body?      Fall 7. AGGRAVATING FACTORS: What makes the knee pain worse? (e.g., walking, climbing stairs, running)     Walking up and down the stairs  8. ASSOCIATED SYMPTOMS: Is there any swelling or redness of the knee?     States she is having to be careful and hold onto things when she's walking to prevent falls  9. OTHER SYMPTOMS: Do you have any  other symptoms? (e.g., calf pain, chest pain, difficulty breathing, fever)     States leg is swollen at baseline due to lymphedema, denies chest pain, denies difficulty breathing, denies fever, unable to determine redness     States she went to [facility] immediately after fall and pain is persisting.  Protocols used: Knee Pain-A-AH

## 2023-08-30 ENCOUNTER — Encounter: Payer: Self-pay | Admitting: Family Medicine

## 2023-08-30 ENCOUNTER — Ambulatory Visit: Admitting: Family Medicine

## 2023-08-30 VITALS — BP 116/60 | HR 81 | Temp 98.1°F | Wt 212.9 lb

## 2023-08-30 DIAGNOSIS — M25562 Pain in left knee: Secondary | ICD-10-CM | POA: Diagnosis not present

## 2023-08-30 NOTE — Progress Notes (Signed)
 Established Patient Office Visit  Subjective   Patient ID: Lindsay Parks, female    DOB: [birth year]  Age: 68 y.o. MRN: [id number]  Chief Complaint  Patient presents with   Knee Pain    HPI   Lindsay Parks is seen today with left knee pain for approximately past 6 weeks.  She was trying to put on compression garments for her chronic lymphedema and basically lost her balance and fell.  She recalls landing on her anterior knees.  Was able to bear weight afterwards.  She actually went to [facility] at the time of her injury and had x-rays which showed no acute fracture.  Was placed on meloxicam but has not seen any real improvement.  Her pain is somewhat medial and lateral but definitely anterior.  No locking or giving way.  She has chronic edema left lower extremity from lymphedema which is essentially unchanged.  No obvious effusion.  No visible ecchymosis and no increased warmth or erythema.  Pain is much worse with going up stairs especially and also downstairs versus level ground.  Not improved after 6 weeks.  Past Medical History:  Diagnosis Date   Allergy    SEASONAL   Anxiety    Arthritis    Cataract    Depression    GERD (gastroesophageal reflux disease)    no longer since cholecystectomy   History of esophageal stricture    History of pancreatitis    Hx of colonic polyps    Lymph edema    Milroys disease  left more than right   Meningitis    PONV (postoperative nausea and vomiting)    UTI (lower urinary tract infection)    Past Surgical History:  Procedure Laterality Date   ANTERIOR CERVICAL DECOMP/DISCECTOMY FUSION N/A 03/16/2017   Procedure: ANTERIOR CERVICAL DISCECTOMY AND FUSION FOR DECOMPRESSION ONE  LEVEL CERVICAL SIX-SEVEN  RIGHT SIDED APPROACH;  Surgeon: Burnetta Aures, MD;  Location: [facility] OR;  Service: Orthopedics;  Laterality: N/A;   CERVICAL DISCECTOMY     c5 and c6   CHOLECYSTECTOMY     COLONOSCOPY  05/17/2011   Dr.Kaplan   ELBOW SURGERY      ESOPHAGOGASTRODUODENOSCOPY     FLEXIBLE SIGMOIDOSCOPY     PARTIAL HYSTERECTOMY  01/04/2003   left hydrosalpinx ovarian cyst   SHOULDER ARTHROSCOPY Left 12/2020   SHOULDER SURGERY     TOTAL ABDOMINAL HYSTERECTOMY      reports that she has never smoked. She has never used smokeless tobacco. She reports current alcohol use. She reports that she does not use drugs. family history includes Arthritis in her mother; Cancer in her father; Colon cancer in her father, paternal aunt, paternal grandmother, and paternal uncle; Diabetes in her mother and another family member; Glaucoma in her father; Gout in her father; Heart disease in her maternal grandmother and mother; Hyperlipidemia in her father and mother; Hypertension in her father and mother; Kidney disease in her paternal uncle; Osteoporosis in her mother; Pancreatic cancer in her paternal grandfather; Stroke in her mother. No Known Allergies  Review of Systems  Neurological:  Negative for focal weakness.      Objective:     BP 116/60   Pulse 81   Temp 98.1 F (36.7 C) (Oral)   Wt 212 lb 14.4 oz (96.6 kg)   SpO2 96%   BMI 35.43 kg/m    Physical Exam Vitals reviewed.  Constitutional:      General: She is not in acute distress. Cardiovascular:  Rate and Rhythm: Normal rate and regular rhythm.  Musculoskeletal:     Comments: Chronic lymphedema left lower extremity.  Left knee reveals no ecchymosis.  No warmth.  No erythema.  No obvious effusion.  Mild tenderness medially and laterally just superior to the joint space region.  Full range of motion.  Neurological:     Mental Status: She is alert.      No results found for any visits on 08/30/23.    The 10-year ASCVD risk score (Arnett DK, et al., 2019) is: 5.8%* (Cholesterol units were assumed)    Assessment & Plan:   Problem List Items Addressed This Visit   None Visit Diagnoses       Acute pain of left knee    -  Primary   Relevant Orders   Ambulatory referral  to Sports Medicine     6-week history of persistent left knee pain following acute fall.  Went to [facility] and had reportedly x-rays (plain films) which showed no fracture.  She has not improved any with meloxicam.  Recommend referral to sports medicine for further evaluation and patient agrees with this plan  No follow-ups on file.    Wolm Scarlet, MD

## 2023-08-31 NOTE — Progress Notes (Unsigned)
 I, Claretha Schimke am a scribe for Dr. Artist Lloyd, MD.  Lindsay Parks is a 68 y.o. female who presents to [facility] today for L knee pain. Pt suffered a fall in July, landing on both knees and side. Patient was twisted when she fell. Pt locates pain to both sides of the left knee. Feels more pressure on the bottom of her foot than before the fall. Pain doesn't wake her up at night and no pain as long as she isn't on her feet.   Pt was seen at [facility] following the injury.   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  She has custom compression stockings from [facility] medical supply.   Exam:  BP 122/70   Pulse 71   Ht 5' 5 (1.651 m)   Wt 210 lb (95.3 kg)   SpO2 97%   BMI 34.95 kg/m  General: Well Developed, well nourished, and in no acute distress.   MSK: Left knee mild effusion normal.  Otherwise normal motion.  Tender palpation anterior knee.  Left leg chronic lymphedema.  Left foot some swelling.  Mild tender palpation plantar metatarsal heads 2 3 and 4.  Normal foot and ankle motion.    Lab and Radiology Results  Procedure: Real-time Ultrasound Guided Injection of left knee joint superior lateral patella space Device: Philips Affiniti 50G/GE Logiq Images permanently stored and available for review in PACS Verbal informed consent obtained.  Discussed risks and benefits of procedure. Warned about infection, bleeding, hyperglycemia damage to structures among others. Patient expresses understanding and agreement Time-out conducted.   Noted no overlying erythema, induration, or other signs of local infection.   Skin prepped in a sterile fashion.   Local anesthesia: Topical Ethyl chloride.   With sterile technique and under real time ultrasound  guidance: 40 mg of Kenalog and 2 mL of Marcaine  injected into knee joint. Fluid seen entering the joint capsule.   Completed without difficulty   Pain immediately resolved suggesting accurate placement of the medication.   Advised to call if fevers/chills, erythema, induration, drainage, or persistent bleeding.   Images permanently stored and available for review in the ultrasound unit.  Impression: Technically successful ultrasound guided injection.   X-ray images left foot obtained today personally and independently interpreted. Moderate midfoot DJD. No fractures.No stress fracture metatarsals.  No severe DJD at MTP joint 2-4 Await formal radiology review    Assessment and Plan: 68 y.o. female with left knee pain after fall due to exacerbation of underlying DJD.  Plan for steroid injection today.  Left foot pain predominantly due to metatarsalgia.  The foot is affected by lymphedema which may be a factor as well.  Plan for metatarsal pads and Voltaren gel.  Chronic lymphedema.  This has been ongoing for most of her adult life.  She has custom compression stockings which have been helpful.  Happy to refill these are manage lymphedema anyway I can in the meantime.  She does have an established visit appointment scheduled in several months with heart and vascular clinic.   PDMP not reviewed this encounter. Orders Placed This Encounter  Procedures   US  LIMITED JOINT SPACE STRUCTURES LOW LEFT(NO LINKED CHARGES)    Reason for Exam (SYMPTOM  OR DIAGNOSIS REQUIRED):   knee pain    Preferred imaging location?:   [facility]  DG Foot Complete Left    Standing Status:   Future    Expiration Date:   08/31/2024    Reason for Exam (SYMPTOM  OR DIAGNOSIS REQUIRED):   left foot pain    Preferred imaging location?:   [facility]   No orders of the defined types were placed in this encounter.    Discussed warning signs or symptoms. Please see discharge  instructions. Patient expresses understanding.   The above documentation has been reviewed and is accurate and complete Artist Lloyd, M.D.

## 2023-09-01 ENCOUNTER — Other Ambulatory Visit: Payer: Self-pay

## 2023-09-01 ENCOUNTER — Ambulatory Visit: Admitting: Family Medicine

## 2023-09-01 ENCOUNTER — Ambulatory Visit (INDEPENDENT_AMBULATORY_CARE_PROVIDER_SITE_OTHER)

## 2023-09-01 VITALS — BP 122/70 | HR 71 | Ht 65.0 in | Wt 210.0 lb

## 2023-09-01 DIAGNOSIS — M79672 Pain in left foot: Secondary | ICD-10-CM

## 2023-09-01 DIAGNOSIS — M25562 Pain in left knee: Secondary | ICD-10-CM | POA: Diagnosis not present

## 2023-09-01 DIAGNOSIS — G8929 Other chronic pain: Secondary | ICD-10-CM

## 2023-09-01 DIAGNOSIS — M1712 Unilateral primary osteoarthritis, left knee: Secondary | ICD-10-CM

## 2023-09-01 DIAGNOSIS — I890 Lymphedema, not elsewhere classified: Secondary | ICD-10-CM | POA: Diagnosis not present

## 2023-09-01 NOTE — Patient Instructions (Addendum)
 Thank you for coming in today.   Please get an Xray today before you leave   HAPAD metatarsal pads  Could try using a percussive massager or massage gun  Please use Voltaren gel (Generic Diclofenac Gel) up to 4x daily for pain as needed.  This is available over-the-counter as both the name brand Voltaren gel and the generic diclofenac gel.   Check back as needed

## 2023-09-13 ENCOUNTER — Ambulatory Visit: Payer: Self-pay | Admitting: Family Medicine

## 2023-09-13 NOTE — Progress Notes (Signed)
Left foot x-ray looks okay to radiology

## 2023-09-23 LAB — LAB REPORT - SCANNED: EGFR: 54

## 2023-11-02 ENCOUNTER — Telehealth: Payer: Self-pay

## 2023-11-02 NOTE — Telephone Encounter (Signed)
 Copied from CRM [id number]. Topic: Clinical - Lab/Test Results >> Oct 31, 2023  2:55 PM Ashley R wrote: Reason for CRM: Lab results sent from [facility], recd 9/19. Callback or message to discuss results. [phone]

## 2023-11-03 ENCOUNTER — Other Ambulatory Visit: Payer: Self-pay

## 2023-11-03 DIAGNOSIS — R600 Localized edema: Secondary | ICD-10-CM

## 2023-11-07 ENCOUNTER — Encounter: Payer: Self-pay | Admitting: Internal Medicine

## 2023-11-07 ENCOUNTER — Telehealth (INDEPENDENT_AMBULATORY_CARE_PROVIDER_SITE_OTHER): Admitting: Internal Medicine

## 2023-11-07 VITALS — BP 127/87 | HR 87 | Ht 65.0 in | Wt 200.0 lb

## 2023-11-07 DIAGNOSIS — R7989 Other specified abnormal findings of blood chemistry: Secondary | ICD-10-CM

## 2023-11-07 DIAGNOSIS — Z79899 Other long term (current) drug therapy: Secondary | ICD-10-CM | POA: Diagnosis not present

## 2023-11-07 DIAGNOSIS — R944 Abnormal results of kidney function studies: Secondary | ICD-10-CM | POA: Diagnosis not present

## 2023-11-07 DIAGNOSIS — Z8419 Family history of other disorders of kidney and ureter: Secondary | ICD-10-CM | POA: Diagnosis not present

## 2023-11-07 NOTE — Patient Instructions (Signed)
 Plan labs and urine and follow

## 2023-11-07 NOTE — Progress Notes (Signed)
 Virtual Visit via Video Note  I connected with Lindsay Parks on 11/07/23 at  3:30 PM EST by a video enabled telemedicine application and verified that I am speaking with the correct person using two identifiers. Location patient: home Location provider:work office Persons participating in the virtual visit: patient, provider   Patient aware  of the limitations of evaluation and management by telemedicine and  availability of in person appointments. and agreed to proceed.   HPI: Lindsay Parks presents for video visit   Lab  from [facility]  had lab in summer  cr was 1.1  range   egfr 54  and  lipid ups some  has since worked on [employer] and hydration , States bp is always good range but up today when went up steps to get monitor and coming down readings . Lipids was alos elevated  [id number]  Mom has esrd and diabetes  in her 90s with dementia .   No nsaids   ROS: See pertinent positives and negatives per HPI.  Past Medical History:  Diagnosis Date   Allergy    SEASONAL   Anxiety    Arthritis    Cataract    Depression    GERD (gastroesophageal reflux disease)    no longer since cholecystectomy   History of esophageal stricture    History of pancreatitis    Hx of colonic polyps    Lymph edema    Milroys disease  left more than right   Meningitis    PONV (postoperative nausea and vomiting)    UTI (lower urinary tract infection)     Past Surgical History:  Procedure Laterality Date   ANTERIOR CERVICAL DECOMP/DISCECTOMY FUSION N/A 03/16/2017   Procedure: ANTERIOR CERVICAL DISCECTOMY AND FUSION FOR DECOMPRESSION ONE  LEVEL CERVICAL SIX-SEVEN  RIGHT SIDED APPROACH;  Surgeon: Burnetta Aures, MD;  Location: [facility] OR;  Service: Orthopedics;  Laterality: N/A;   CERVICAL DISCECTOMY     c5 and c6   CHOLECYSTECTOMY     COLONOSCOPY  05/17/2011   Dr.Kaplan   ELBOW SURGERY     ESOPHAGOGASTRODUODENOSCOPY     FLEXIBLE SIGMOIDOSCOPY     PARTIAL HYSTERECTOMY  01/04/2003    left hydrosalpinx ovarian cyst   SHOULDER ARTHROSCOPY Left 12/2020   SHOULDER SURGERY     TOTAL ABDOMINAL HYSTERECTOMY      Family History  Problem Relation Age of Onset   Stroke Mother    Diabetes Mother    Hypertension Mother    Heart disease Mother    Osteoporosis Mother    Arthritis Mother    Hyperlipidemia Mother    Colon cancer Father        late 62's early 26's when dx   Glaucoma Father    Hypertension Father    Gout Father    Hyperlipidemia Father    Cancer Father    Colon cancer Paternal Aunt    Colon cancer Paternal Uncle    Kidney disease Paternal Uncle    Heart disease Maternal Grandmother    Colon cancer Paternal Grandmother        aunts and uncles   Pancreatic cancer Paternal Grandfather        lung, and stomach   Diabetes Other        grandson   Esophageal cancer Neg Hx    Stomach cancer Neg Hx    Rectal cancer Neg Hx     Social History   Tobacco Use   Smoking status: Never  Smokeless tobacco: Never  Vaping Use   Vaping status: Never Used  Substance Use Topics   Alcohol use: Yes    Comment: socially-occ wine 1-2 times per month   Drug use: No      Current Outpatient Medications:    calcium-vitamin D (OSCAL WITH D) 500-200 MG-UNIT per tablet, Take 1 tablet by mouth daily.  , Disp: , Rfl:    diphenhydrAMINE (BENADRYL) 25 MG tablet, Take 25 mg by mouth every 8 (eight) hours as needed for allergies (allergy headaches.). , Disp: , Rfl:    estradiol  (VIVELLE -DOT) 0.05 MG/24HR patch, PLACE 1 PATCH ONTO THE SKIN 2 TIMES A WEEK., Disp: 24 patch, Rfl: 3   ezetimibe (ZETIA) 10 MG tablet, Take 10 mg by mouth at bedtime., Disp: , Rfl:    fluocinonide (LIDEX) 0.05 % cream, Apply 1 application topically 2 (two) times daily as needed (for eczema). , Disp: , Rfl:    LORazepam  (ATIVAN ) 0.5 MG tablet, Take 0.5 mg by mouth as needed. , Disp: , Rfl: 0   Omega-3 Fatty Acids (FISH OIL PO), Take by mouth., Disp: , Rfl:    OZEMPIC, 0.25 OR 0.5 MG/DOSE, 2 MG/1.5ML  SOPN, Inject 0.5 mg into the skin once a week. (Patient taking differently: Inject 0.5 mg into the skin once a week. Taking 2mg .), Disp: , Rfl:    pravastatin (PRAVACHOL) 10 MG tablet, Take 10 mg by mouth at bedtime., Disp: , Rfl:    QUEtiapine (SEROQUEL XR) 300 MG 24 hr tablet, Take by mouth., Disp: , Rfl:    zolpidem  (AMBIEN ) 5 MG tablet, Take 5 mg by mouth at bedtime., Disp: , Rfl:    benzonatate  (TESSALON ) 100 MG capsule, Take 1 capsule (100 mg total) by mouth 2 (two) times daily as needed for cough. (Patient not taking: Reported on 11/07/2023), Disp: 20 capsule, Rfl: 0  EXAM: BP Readings from Last 3 Encounters:  11/07/23 127/87  09/01/23 122/70  08/30/23 116/60    VITALS per patient if applicable:  GENERAL: alert, oriented, appears well and in no acute distress  HEENT: atraumatic, conjunttiva clear, no obvious abnormalities on inspection of external nose and ears  NECK: normal movements of the head and neck  LUNGS: on inspection no signs of respiratory distress, breathing rate appears normal, no obvious gross SOB, gasping or wheezing  CV: no obvious cyanosis  MS: moves all visible extremities without noticeable abnormality  PSYCH/NEURO: pleasant and cooperative, no obvious depression or anxiety, speech and thought processing grossly intact Lab Results  Component Value Date   WBC 5.2 07/11/2023   HGB 13.3 07/11/2023   HCT 40 07/11/2023   PLT 277 09/23/2019   GLUCOSE 87 11/28/2022   CHOL 239 (A) 07/11/2023   TRIG 72 07/11/2023   HDL 67 07/11/2023   LDLDIRECT 149.3 06/11/2012   LDLCALC 160 07/11/2023   ALT 17 07/11/2023   AST 28 07/11/2023   NA 140 11/28/2022   K 4.5 11/28/2022   CL 104 07/11/2023   CREATININE 1.1 07/11/2023   BUN 10 07/11/2023   CO2 20 07/11/2023   TSH 1.27 07/11/2023   HGBA1C 5.7 07/11/2023    ASSESSMENT AND PLAN:  Discussed the following assessment and plan:    ICD-10-CM   1. Decreased calculated GFR  R94.4 Basic metabolic panel with GFR     Cystatin C with Glomerular Filtration Rate, Estimated (eGFR)    Microalbumin / creatinine urine ratio    Urinalysis, Complete w Microscopic    2. Family history of CKD (  chronic kidney disease)  Z84.19 Basic metabolic panel with GFR    Cystatin C with Glomerular Filtration Rate, Estimated (eGFR)    Microalbumin / creatinine urine ratio    Urinalysis, Complete w Microscopic    3. Elevated serum creatinine  R79.89 Basic metabolic panel with GFR    Cystatin C with Glomerular Filtration Rate, Estimated (eGFR)    Microalbumin / creatinine urine ratio    Urinalysis, Complete w Microscopic    4. Medication management  Z79.899 Basic metabolic panel with GFR    Cystatin C with Glomerular Filtration Rate, Estimated (eGFR)    Microalbumin / creatinine urine ratio    Urinalysis, Complete w Microscopic    At risk states bp is at goal on ow side usually  120 range  and below  Mom has dm and  what looks like esrd in her 90 s No nsiads use  Plan f/u labs urine for protein. Etc  If indicated consider  renal us    Ensure that bp is controlled  Can consider renal us  in future but not compelling  to do currently  Counseled.   Expectant management and discussion of plan and treatment with opportunity to ask questions and all were answered. The patient agreed with the plan and demonstrated an understanding of the instructions.   Advised to call back or seek an in-person evaluation if worsening  or having  further concerns  in interim. Return for lab and fu , depending on results.    Apolinar Eastern, MD

## 2023-12-12 ENCOUNTER — Ambulatory Visit (INDEPENDENT_AMBULATORY_CARE_PROVIDER_SITE_OTHER): Admitting: Vascular Surgery

## 2023-12-12 ENCOUNTER — Encounter: Payer: Self-pay | Admitting: Vascular Surgery

## 2023-12-12 ENCOUNTER — Ambulatory Visit (HOSPITAL_COMMUNITY)
Admission: RE | Admit: 2023-12-12 | Discharge: 2023-12-12 | Disposition: A | Source: Ambulatory Visit | Attending: Surgery | Admitting: Surgery

## 2023-12-12 VITALS — BP 117/79 | HR 81 | Temp 97.8°F | Ht 65.0 in | Wt 200.0 lb

## 2023-12-12 DIAGNOSIS — R600 Localized edema: Secondary | ICD-10-CM

## 2023-12-12 DIAGNOSIS — I890 Lymphedema, not elsewhere classified: Secondary | ICD-10-CM

## 2023-12-12 NOTE — Progress Notes (Signed)
 [facility] [place] PROGRESS NOTE  ASSESSMENT / PLAN: Lindsay Parks is a 68 y.o. female with primary lymphedema left lower extremity.  Continue compression and elevation.  Patient may be referred to [facility] for lymphedema care should she desire.  Otherwise follow-up with me in 5 years.  SUBJECTIVE: Very pleasant 68 year old woman lifelong diagnosis of primary lymphedema of the left lower extremity.  The patient is very effective at managing this on her own.  She is compliant with compression therapy.  She has tried lymphedema physical therapy and pump therapy with mixed results.  I counseled her that at the present moment, there is no other opportunity for improving lymphedema symptoms.  I offered her referral to [facility], but she prefers to not do this for now as little has changed since the last went to [facility].  She can follow-up with me in 5 years to reevaluate her symptoms and to see if any advances have been made in lymphedema care.  OBJECTIVE: BP 117/79 (BP Location: Left Arm, Patient Position: Sitting, Cuff Size: Large)   Pulse 81   Temp 97.8 F (36.6 C)   Ht 5' 5 (1.651 m)   Wt 200 lb (90.7 kg)   SpO2 97%   BMI 33.28 kg/m   No acute distress Regular rate and rhythm Unlabored breathing Left leg warm and well-perfused with skin changes consistent with lymphedema     Latest Ref Rng & Units 07/11/2023   12:00 AM 09/23/2019   11:55 AM 04/20/2017   11:16 AM  CBC  WBC  5.2     5.4  6.0   Hemoglobin 12.0 - 16.0 13.3     13.3  13.3   Hematocrit 36 - 46 40     40.6  39.9   Platelets 140 - 400 Thousand/uL  277  256.0      This result is from an external source.        Latest Ref Rng & Units 07/11/2023   12:00 AM 11/28/2022    9:40 AM 09/22/2022   10:38 AM  CMP  Glucose 65 - 99 mg/dL  87  92   BUN 4 - 21 10     11  11    Creatinine 0.5 - 1.1 1.1     1.24  1.17   Sodium 135 - 146 mmol/L  140  142   Potassium 3.5 - 5.3 mmol/L  4.5  4.2   Chloride 99 - 108 104      105  105   CO2 13 - 22 20     24  30    Calcium 8.7 - 10.7 9.3     9.4  9.3   Total Protein 6.0 - 8.3 g/dL   6.5   Total Bilirubin 0.2 - 1.2 mg/dL   0.3   Alkaline Phos 39 - 117 U/L   94   AST 13 - 35 28      17   ALT 7 - 35 U/L 17      15      This result is from an external source.    Debby SAILOR. Magda, MD [facility] Phone Number: [phone] 12/12/2023 5:10 PM

## 2024-01-05 LAB — HM MAMMOGRAPHY

## 2024-01-09 ENCOUNTER — Encounter: Payer: Self-pay | Admitting: Internal Medicine
